# Patient Record
Sex: Female | Born: 1950 | Race: White | Hispanic: No | Marital: Married | Smoking: Current every day smoker
Health system: Southern US, Community
[De-identification: ages and names within clinical notes are randomized; demographics above are authoritative.]

## PROBLEM LIST (undated history)

## (undated) DIAGNOSIS — I509 Heart failure, unspecified: Secondary | ICD-10-CM

## (undated) DIAGNOSIS — I2510 Atherosclerotic heart disease of native coronary artery without angina pectoris: Secondary | ICD-10-CM

## (undated) HISTORY — PX: CARDIAC SURGERY: SHX584

---

## 2012-04-07 DIAGNOSIS — I781 Nevus, non-neoplastic: Secondary | ICD-10-CM | POA: Insufficient documentation

## 2015-07-14 LAB — HM PAP SMEAR: HM Pap smear: NORMAL

## 2015-10-24 LAB — HM HEPATITIS C SCREENING LAB: HM Hepatitis Screen: NEGATIVE

## 2016-05-09 DIAGNOSIS — I219 Acute myocardial infarction, unspecified: Secondary | ICD-10-CM | POA: Insufficient documentation

## 2016-05-26 DIAGNOSIS — R7989 Other specified abnormal findings of blood chemistry: Secondary | ICD-10-CM | POA: Insufficient documentation

## 2016-05-26 DIAGNOSIS — E039 Hypothyroidism, unspecified: Secondary | ICD-10-CM | POA: Insufficient documentation

## 2016-05-26 DIAGNOSIS — I10 Essential (primary) hypertension: Secondary | ICD-10-CM | POA: Insufficient documentation

## 2016-05-26 DIAGNOSIS — E785 Hyperlipidemia, unspecified: Secondary | ICD-10-CM | POA: Insufficient documentation

## 2016-05-26 DIAGNOSIS — I2119 ST elevation (STEMI) myocardial infarction involving other coronary artery of inferior wall: Secondary | ICD-10-CM | POA: Insufficient documentation

## 2016-05-26 DIAGNOSIS — D72829 Elevated white blood cell count, unspecified: Secondary | ICD-10-CM | POA: Insufficient documentation

## 2016-05-27 DIAGNOSIS — D62 Acute posthemorrhagic anemia: Secondary | ICD-10-CM | POA: Insufficient documentation

## 2016-05-27 DIAGNOSIS — I2510 Atherosclerotic heart disease of native coronary artery without angina pectoris: Secondary | ICD-10-CM | POA: Insufficient documentation

## 2016-06-17 DIAGNOSIS — I472 Ventricular tachycardia, unspecified: Secondary | ICD-10-CM | POA: Insufficient documentation

## 2016-06-17 DIAGNOSIS — J810 Acute pulmonary edema: Secondary | ICD-10-CM | POA: Insufficient documentation

## 2016-06-17 DIAGNOSIS — I340 Nonrheumatic mitral (valve) insufficiency: Secondary | ICD-10-CM | POA: Insufficient documentation

## 2016-06-23 DIAGNOSIS — R55 Syncope and collapse: Secondary | ICD-10-CM | POA: Insufficient documentation

## 2016-06-23 DIAGNOSIS — N3000 Acute cystitis without hematuria: Secondary | ICD-10-CM | POA: Insufficient documentation

## 2016-06-23 DIAGNOSIS — N189 Chronic kidney disease, unspecified: Secondary | ICD-10-CM | POA: Insufficient documentation

## 2016-09-30 LAB — HM COLONOSCOPY

## 2016-11-24 DIAGNOSIS — Z79899 Other long term (current) drug therapy: Secondary | ICD-10-CM | POA: Insufficient documentation

## 2016-11-24 DIAGNOSIS — R609 Edema, unspecified: Secondary | ICD-10-CM | POA: Insufficient documentation

## 2016-11-24 DIAGNOSIS — R0602 Shortness of breath: Secondary | ICD-10-CM | POA: Insufficient documentation

## 2016-11-24 DIAGNOSIS — R17 Unspecified jaundice: Secondary | ICD-10-CM | POA: Insufficient documentation

## 2016-11-24 DIAGNOSIS — M79605 Pain in left leg: Secondary | ICD-10-CM | POA: Insufficient documentation

## 2016-11-24 DIAGNOSIS — R600 Localized edema: Secondary | ICD-10-CM | POA: Insufficient documentation

## 2016-11-29 DIAGNOSIS — D75839 Thrombocytosis, unspecified: Secondary | ICD-10-CM | POA: Insufficient documentation

## 2016-11-29 DIAGNOSIS — R0789 Other chest pain: Secondary | ICD-10-CM | POA: Insufficient documentation

## 2016-11-29 DIAGNOSIS — K922 Gastrointestinal hemorrhage, unspecified: Secondary | ICD-10-CM | POA: Insufficient documentation

## 2016-11-29 DIAGNOSIS — N183 Chronic kidney disease, stage 3 unspecified: Secondary | ICD-10-CM | POA: Insufficient documentation

## 2019-04-27 ENCOUNTER — Ambulatory Visit (INDEPENDENT_AMBULATORY_CARE_PROVIDER_SITE_OTHER): Payer: Medicare Other

## 2019-04-27 ENCOUNTER — Ambulatory Visit (INDEPENDENT_AMBULATORY_CARE_PROVIDER_SITE_OTHER): Payer: Medicare Other | Admitting: Podiatry

## 2019-04-27 ENCOUNTER — Encounter: Payer: Self-pay | Admitting: Podiatry

## 2019-04-27 ENCOUNTER — Other Ambulatory Visit: Payer: Self-pay

## 2019-04-27 VITALS — Temp 98.5°F

## 2019-04-27 DIAGNOSIS — M79671 Pain in right foot: Secondary | ICD-10-CM

## 2019-04-27 DIAGNOSIS — L84 Corns and callosities: Secondary | ICD-10-CM

## 2019-04-27 DIAGNOSIS — M2042 Other hammer toe(s) (acquired), left foot: Secondary | ICD-10-CM

## 2019-04-27 DIAGNOSIS — L02611 Cutaneous abscess of right foot: Secondary | ICD-10-CM

## 2019-04-27 DIAGNOSIS — M2041 Other hammer toe(s) (acquired), right foot: Secondary | ICD-10-CM

## 2019-04-27 DIAGNOSIS — Z008 Encounter for other general examination: Secondary | ICD-10-CM | POA: Insufficient documentation

## 2019-04-27 MED ORDER — DOXYCYCLINE HYCLATE 100 MG PO TABS: 100.0000 mg | ORAL_TABLET | Freq: Two times a day (BID) | ORAL | 0 refills | Status: DC

## 2019-04-27 NOTE — Patient Instructions (Signed)
Hammer Toe  Hammer toe is a change in the shape (a deformity) of your toe. The deformity causes the middle joint of your toe to stay bent. This causes pain, especially when you are wearing shoes. Hammer toe starts gradually. At first, the toe can be straightened. Gradually over time, the deformity becomes stiff and permanent. Early treatments to keep the toe straight may relieve pain. As the deformity becomes stiff and permanent, surgery may be needed to straighten the toe. What are the causes? Hammer toe is caused by abnormal bending of the toe joint that is closest to your foot. It happens gradually over time. This pulls on the muscles and connections (tendons) of the toe joint, making them weak and stiff. It is often related to wearing shoes that are too short or narrow and do not let your toes straighten. What increases the risk? You may be at greater risk for hammer toe if you:  Are female.  Are older.  Wear shoes that are too small.  Wear high-heeled shoes that pinch your toes.  Are a ballet dancer.  Have a second toe that is longer than your big toe (first toe).  Injure your foot or toe.  Have arthritis.  Have a family history of hammer toe.  Have a nerve or muscle disorder. What are the signs or symptoms? The main symptoms of this condition are pain and deformity of the toe. The pain is worse when wearing shoes, walking, or running. Other symptoms may include:  Corns or calluses over the bent part of the toe or between the toes.  Redness and a burning feeling on the toe.  An open sore that forms on the top of the toe.  Not being able to straighten the toe. How is this diagnosed? This condition is diagnosed based on your symptoms and a physical exam. During the exam, your health care provider will try to straighten your toe to see how stiff the deformity is. You may also have tests, such as:  A blood test to check for rheumatoid arthritis.  An X-ray to show how  severe the deformity is. How is this treated? Treatment for this condition will depend on how stiff the deformity is. Surgery is often needed. However, sometimes a hammer toe can be straightened without surgery. Treatments that do not involve surgery include:  Taping the toe into a straightened position.  Using pads and cushions to protect the toe (orthotics).  Wearing shoes that provide enough room for the toes.  Doing toe-stretching exercises at home.  Taking an NSAID to reduce pain and swelling. If these treatments do not help or the toe cannot be straightened, surgery is the next option. The most common surgeries used to straighten a hammer toe include:  Arthroplasty. In this procedure, part of the joint is removed, and that allows the toe to straighten.  Fusion. In this procedure, cartilage between the two bones of the joint is taken out and the bones are fused together into one longer bone.  Implantation. In this procedure, part of the bone is removed and replaced with an implant to let the toe move again.  Flexor tendon transfer. In this procedure, the tendons that curl the toes down (flexor tendons) are repositioned. Follow these instructions at home:  Take over-the-counter and prescription medicines only as told by your health care provider.  Do toe straightening and stretching exercises as told by your health care provider.  Keep all follow-up visits as told by your health care   provider. This is important. How is this prevented?  Wear shoes that give your toes enough room and do not cause pain.  Do not wear high-heeled shoes. Contact a health care provider if:  Your pain gets worse.  Your toe becomes red or swollen.  You develop an open sore on your toe. This information is not intended to replace advice given to you by your health care provider. Make sure you discuss any questions you have with your health care provider. Document Revised: 01/07/2017 Document  Reviewed: 05/21/2015 Elsevier Patient Education  2020 [employer]  

## 2019-04-27 NOTE — Progress Notes (Signed)
Subjective:   Patient ID: Donna Hester, female   DOB: 68 y.o.   MRN: [id number]   HPI 69 year old female presents the office today for concerns of painful callus above her right foot.  She states that she previously had surgery to help with the callus but she states that the callus remains.  She states another podiatrist needs to trim the callus without any significant long-term relief.  She also discussed treatment options.  She also states her nails are thick and discolored her left foot on toes 3 through 5 which is been ongoing for several years but any pain.  She will get some pain in her right fourth toe at times which is been ongoing for last 2 years.  She has had no recent injury.  No increase in swelling or redness to her feet.  No other concerns.   Review of Systems  All other systems reviewed and are negative.  No past medical history on file.     Current Outpatient Medications:  .  amLODipine (NORVASC) 10 MG tablet, Take 10 mg by mouth daily., Disp: , Rfl:  .  Aspirin Buf,CaCarb-MgCarb-MgO, 81 MG TABS, Take by mouth., Disp: , Rfl:  .  atorvastatin (LIPITOR) 20 MG tablet, Take 20 mg by mouth daily., Disp: , Rfl:  .  Cholecalciferol 125 MCG (5000 UT) TABS, Take by mouth., Disp: , Rfl:  .  gabapentin (NEURONTIN) 800 MG tablet, Take by mouth., Disp: , Rfl:  .  HYDROcodone-acetaminophen (NORCO/VICODIN) 5-325 MG tablet, hydrocodone 5 mg-acetaminophen 325 mg tablet, Disp: , Rfl:  .  levothyroxine (SYNTHROID) 88 MCG tablet, levothyroxine 88 mcg tablet, Disp: , Rfl:  .  lisinopril (ZESTRIL) 40 MG tablet, lisinopril 40 mg tablet, Disp: , Rfl:  .  metoprolol succinate (TOPROL-XL) 50 MG 24 hr tablet, metoprolol succinate ER 50 mg tablet,extended release 24 hr, Disp: , Rfl:  .  nitroGLYCERIN (NITROSTAT) 0.4 MG SL tablet, nitroglycerin 0.4 mg sublingual tablet, Disp: , Rfl:  .  Omega-3 Fatty Acids (FISH OIL) 1000 MG CAPS, Take by mouth., Disp: , Rfl:  .  doxycycline (VIBRA-TABS) 100 MG  tablet, Take 1 tablet (100 mg total) by mouth 2 (two) times daily., Disp: 20 tablet, Rfl: 0  No Known Allergies        Objective:  Physical Exam  General: AAO x3, NAD  Dermatological: Hyperkeratotic tissue submetatarsal 4 upon debridement there was small amount of purulence identified.  There is no underlying ulceration noted.  Superficial abscess.  There is no surrounding erythema, ascending cellulitis.  There is no fluctuation crepitation.  There is no malodor.  Prominent metatarsal heads.  Hammertoe contractures are present.  Nails appear to be hypertrophic, dystrophic with yellow-brown discoloration.  Vascular: Dorsalis Pedis artery and Posterior Tibial artery pedal pulses are 2/4 bilateral with immedate capillary fill time. There is no pain with calf compression, swelling, warmth, erythema.   Neruologic: Grossly intact via light touch bilateral. Vibratory intact via tuning fork bilateral. Protective threshold with Semmes Wienstein monofilament intact to all pedal sites bilateral. Patellar and Achilles deep tendon reflexes 2+ bilateral. No Babinski or clonus noted bilateral.   Musculoskeletal: Hammertoes present with prominent metatarsal heads.  Muscular strength 5/5 in all groups tested bilateral.  Gait: Unassisted, Nonantalgic.       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 -I debrided the  hyperkeratotic lesion to reveal drainage underneath.  This was cultured today.  Will start doxycycline.  Offloading pads were dispensed.  -Monitor for any clinical signs or symptoms of infection and directed to call the office immediately should any occur or go to the ER.  Return in about 1 week (around 05/04/2019).  Trula Slade DPM

## 2019-05-03 LAB — WOUND CULTURE: RESULT:: NO GROWTH

## 2019-05-04 ENCOUNTER — Ambulatory Visit (INDEPENDENT_AMBULATORY_CARE_PROVIDER_SITE_OTHER): Payer: Medicare Other | Admitting: Podiatry

## 2019-05-04 ENCOUNTER — Encounter: Payer: Self-pay | Admitting: Podiatry

## 2019-05-04 ENCOUNTER — Other Ambulatory Visit: Payer: Self-pay

## 2019-05-04 VITALS — Temp 97.2°F

## 2019-05-04 DIAGNOSIS — L02611 Cutaneous abscess of right foot: Secondary | ICD-10-CM

## 2019-05-04 DIAGNOSIS — L84 Corns and callosities: Secondary | ICD-10-CM | POA: Diagnosis not present

## 2019-05-06 NOTE — Progress Notes (Addendum)
Subjective: 69 year old female presents the office today for follow evaluation of superficial abscess, callus formation to her right foot.  She states that since I last saw her she has had no pain she feels great.  She did take an antibiotic with any side effects.  She has no new concerns today.Denies any systemic complaints such as fevers, chills, nausea, vomiting. No acute changes since last appointment, and no other complaints at this time.   Objective: AAO x3, NAD DP/PT pulses palpable bilaterally, CRT less than 3 seconds Hyperkeratotic lesion submetatarsal 4 on the right foot.  Upon debridement there is no ongoing ulceration, drainage or any signs of infection.  The superficial abscess appears to be resolved.  Prominent metatarsal heads plantarly. No open lesions or pre-ulcerative lesions.  No pain with calf compression, swelling, warmth, erythema  Assessment: Hyperkeratotic lesion right foot with resolved abscess  Plan: -All treatment options discussed with the patient including all alternatives, risks, complications.  -I reviewed the x-rays with her. -Debrided the hyperkeratotic lesion to the any complications or bleeding.  Recommend moisturizer daily.  Continue metatarsal pad inside of orthotics.  No signs of infection today but finish course of antibiotics. -Patient encouraged to call the office with any questions, concerns, change in symptoms.   Donna Hester DPM

## 2019-06-08 ENCOUNTER — Encounter: Payer: Self-pay | Admitting: Podiatry

## 2019-06-08 ENCOUNTER — Other Ambulatory Visit: Payer: Self-pay

## 2019-06-08 ENCOUNTER — Ambulatory Visit (INDEPENDENT_AMBULATORY_CARE_PROVIDER_SITE_OTHER): Payer: Medicare Other

## 2019-06-08 ENCOUNTER — Ambulatory Visit (INDEPENDENT_AMBULATORY_CARE_PROVIDER_SITE_OTHER): Payer: Medicare Other | Admitting: Podiatry

## 2019-06-08 VITALS — Temp 96.5°F

## 2019-06-08 DIAGNOSIS — M79671 Pain in right foot: Secondary | ICD-10-CM | POA: Diagnosis not present

## 2019-06-08 DIAGNOSIS — M84375A Stress fracture, left foot, initial encounter for fracture: Secondary | ICD-10-CM

## 2019-06-08 DIAGNOSIS — M79672 Pain in left foot: Secondary | ICD-10-CM | POA: Diagnosis not present

## 2019-06-08 DIAGNOSIS — M779 Enthesopathy, unspecified: Secondary | ICD-10-CM | POA: Diagnosis not present

## 2019-06-08 DIAGNOSIS — L84 Corns and callosities: Secondary | ICD-10-CM

## 2019-06-08 NOTE — Progress Notes (Signed)
Subjective: 69 year old female presents the office today for concerns of painful calluses recurrent on the right foot.  She denies any open sores and denies any redness or drainage or any swelling to this area.  She does have swelling to both of her ankles which is been an intermittent issue for her previously.  She has no pain associated with swelling.  She also states that she was having pain to her left foot she points on the second MPJ.  She states the bottom of her foot this area the area was very swollen she had tenderness but has been getting better over the last couple of days.  Denies any open sores. Denies any systemic complaints such as fevers, chills, nausea, vomiting. No acute changes since last appointment, and no other complaints at this time.   Objective: AAO x3, NAD DP/PT pulses palpable bilaterally, CRT less than 3 seconds; bilateral ankle edema is present without any pain, erythema. Hyperkeratotic tissue present right foot submetatarsal area.  No underlying ulceration drainage or signs of infection.  No signs of an abscess. There is tenderness submetatarsal 2 on the left foot and there is mild edema to the second MPJ.  There is no erythema or warmth.  No area of pinpoint tenderness. No edema, erythema, increase in warmth to bilateral lower extremities.  No open lesions or pre-ulcerative lesions.  No pain with calf compression, swelling, warmth, erythema  Assessment: Left foot second MPJ capsulitis; right foot hyperkeratotic lesion; ankle swelling  Plan: -All treatment options discussed with the patient including all alternatives, risks, complications.  -X-rays obtained of the left foot.  Independently reviewed these and I was not able to appreciate any evidence of acute fracture.  A steroid injection was performed of the left second interspace today.  Skin was cleaned with alcohol and mixture of 1 cc Kenalog 10, 0.5 cc of Marcaine plain, 0.5 cc of lidocaine plain was infiltrated  into the area of maximal tenderness without complications.  Postinjection care discussed. -Debrided hyperkeratotic lesion right foot without any complications or bleeding. -Dispensed metatarsal offloading gel pads -Recommend follow-up with cardiology for ankle swelling.  She has no chest pain, shortness of breath or any other issues. No pain to the leg or any swelling to the legs.  -Patient encouraged to call the office with any questions, concerns, change in symptoms.   Trula Slade DPM

## 2019-07-13 ENCOUNTER — Encounter: Payer: Self-pay | Admitting: Podiatry

## 2019-07-13 ENCOUNTER — Ambulatory Visit (INDEPENDENT_AMBULATORY_CARE_PROVIDER_SITE_OTHER): Payer: Medicare Other | Admitting: Podiatry

## 2019-07-13 ENCOUNTER — Other Ambulatory Visit: Payer: Self-pay

## 2019-07-13 VITALS — Temp 98.5°F

## 2019-07-13 DIAGNOSIS — M779 Enthesopathy, unspecified: Secondary | ICD-10-CM

## 2019-07-13 DIAGNOSIS — M2041 Other hammer toe(s) (acquired), right foot: Secondary | ICD-10-CM | POA: Diagnosis not present

## 2019-07-13 DIAGNOSIS — M2042 Other hammer toe(s) (acquired), left foot: Secondary | ICD-10-CM

## 2019-07-13 DIAGNOSIS — L989 Disorder of the skin and subcutaneous tissue, unspecified: Secondary | ICD-10-CM | POA: Diagnosis not present

## 2019-07-13 NOTE — Progress Notes (Signed)
Subjective: 70 year old female presents the office today for painful callus on the right foot which likely trimmed.  Denies any redness or drainage or any swelling.  The injection last appointment was helpful she is not on any pain or swelling.  She is notes her left second toe started to trip towards her big toe some but not causing any pain.  Currently denies any pain to the ankles.  No recent injury or changes otherwise.  She has not yet followed with cardiology for ankle swelling she has no chest pain, shortness of breath or any other symptoms. Denies any systemic complaints such as fevers, chills, nausea, vomiting. No acute changes since last appointment, and no other complaints at this time.   Objective: AAO x3, NAD DP/PT pulses palpable bilaterally, CRT less than 3 seconds Thick hyperkeratotic tissue right foot submetatarsal 3 area upon debridement there is no underlying ulceration drainage or any signs of infection noted.  Hyperkeratotic tissue to the distal fourth toe on the right foot as well as second toe without any underlying ulceration.  Proximal metatarsal head plantarly with atrophy of the fat pad.  Hammertoe contractures present with slight medial deviation of the left second toe.  No pain the second MPJ or other areas of the foot bilaterally or the ankles.  No open lesions or pre-ulcerative lesions.  No pain with calf compression, swelling, warmth, erythema  Assessment: 69 year old female with symptomatic preulcerative callus to the right foot with hammertoe contractures  Plan: -All treatment options discussed with the patient including all alternatives, risks, complications.  -I dispensed toe separator's.  Debrided the hyperkeratotic lesions on the right foot with any complications or bleeding.  Continue moisturizer daily as well as offloading.  I discussed with her both conservative as well as surgical options for the recurrent callus and she wants to continue with conservative  debridements periodically.  She was to come back in 6 weeks and I will see her again. -Patient encouraged to call the office with any questions, concerns, change in symptoms.   Trula Slade DPM

## 2019-08-24 ENCOUNTER — Other Ambulatory Visit: Payer: Self-pay

## 2019-08-24 ENCOUNTER — Encounter: Payer: Self-pay | Admitting: Podiatry

## 2019-08-24 ENCOUNTER — Ambulatory Visit (INDEPENDENT_AMBULATORY_CARE_PROVIDER_SITE_OTHER): Payer: Medicare Other | Admitting: Podiatry

## 2019-08-24 VITALS — Temp 98.1°F

## 2019-08-24 DIAGNOSIS — M2041 Other hammer toe(s) (acquired), right foot: Secondary | ICD-10-CM

## 2019-08-24 DIAGNOSIS — L989 Disorder of the skin and subcutaneous tissue, unspecified: Secondary | ICD-10-CM | POA: Diagnosis not present

## 2019-08-24 DIAGNOSIS — M2042 Other hammer toe(s) (acquired), left foot: Secondary | ICD-10-CM | POA: Diagnosis not present

## 2019-08-24 DIAGNOSIS — M216X1 Other acquired deformities of right foot: Secondary | ICD-10-CM

## 2019-08-24 NOTE — Patient Instructions (Signed)
Keep the bandage on for 24 hours. At that time, remove and clean with soap and water. If it hurts or burns before 24 hours go ahead and remove the bandage and wash with soap and water. Keep the area clean. If there is any blistering cover with antibiotic ointment and a bandage. Monitor for any redness, drainage, or other signs of infection. Call the office if any are to occur. If you have any questions, please call the office at [phone].  

## 2019-08-26 NOTE — Progress Notes (Signed)
Subjective: 69 year old female presents the office today for painful callus on the right foot which likely trimmed.  She states the area started become painful again.  She is also concerned that the third toe started becoming hammertoe on the right foot as well as the left second toe.  No changes otherwise.  No other concerns today. Denies any systemic complaints such as fevers, chills, nausea, vomiting. No acute changes since last appointment, and no other complaints at this time.   Objective: AAO x3, NAD DP/PT pulses palpable bilaterally, CRT less than 3 seconds Thick hyperkeratotic tissue right foot submetatarsal 3 area upon debridement there is no underlying ulceration drainage or any signs of infection noted.  Prominent metatarsal head plantarly. Hammertoe contractures present with mild erythema from irritation in shoes. No skin breakdown otherwise. No pain with calf compression, swelling, warmth, erythema  Assessment: 69 year old female with symptomatic preulcerative callus to the right foot with hammertoe contractures  Plan: -All treatment options discussed with the patient including all alternatives, risks, complications.  -Debrided the hyperkeratotic lesion to the any complications or bleeding.  Skin was cleaned with alcohol and a pad was placed followed by salicylic acid and a bandage.  Post procedure instructions discussed.  Monitor for any signs or symptoms of infection discussed with her possibly surgical excision of this area and if she decides to do that she is considering having her hammertoes corrected as well.  Dispensed offloading pads for the hammertoes  Return in about 6 weeks (around 10/05/2019).  Trula Slade DPM

## 2019-09-07 ENCOUNTER — Ambulatory Visit: Payer: Medicare Other | Admitting: Podiatry

## 2019-10-05 ENCOUNTER — Other Ambulatory Visit: Payer: Self-pay

## 2019-10-05 ENCOUNTER — Ambulatory Visit (INDEPENDENT_AMBULATORY_CARE_PROVIDER_SITE_OTHER): Payer: Medicare Other | Admitting: Podiatry

## 2019-10-05 VITALS — Temp 97.4°F

## 2019-10-05 DIAGNOSIS — L989 Disorder of the skin and subcutaneous tissue, unspecified: Secondary | ICD-10-CM | POA: Diagnosis not present

## 2019-10-06 NOTE — Progress Notes (Signed)
Subjective: 69 year old female presents the office today for painful callus on the right foot which likely trimmed.  Last appointment tried salicylic acid and she states that it helps quite a bit.  She has no other concerns to her feet.  Still having gets swelling to her ankles.  She has fluid pills at home but she has not taken.  She does not see her cardiologist until October.  Denies any chest pain or shortness of breath.    Objective: AAO x3, NAD DP/PT pulses palpable bilaterally, CRT less than 3 seconds Chronic ankle edema present with mild pitting edema to the legs bilaterally.  There is no pain with calf compression, erythema or warmth. Thick hyperkeratotic tissue right foot submetatarsal 3 area upon debridement there is no underlying ulceration drainage or any signs of infection noted.  Prominent metatarsal head plantarly. No pain with calf compression, swelling, warmth, erythema  Assessment: 69 year old female with symptomatic preulcerative callus; chronic ankle edema  Plan: -All treatment options discussed with the patient including all alternatives, risks, complications.  -Debrided the hyperkeratotic lesion to the any complications or bleeding.  Skin was cleaned with alcohol and a pad was placed followed by salicylic acid and a bandage.  Post procedure instructions discussed.  Monitor for any signs or symptoms of infection  -She has fluid pills at home but she has not taken.  Discussed taking medication as directed by her physician.  Also encouraged to call her cardiologist.  Unfortunately she does not have a primary care physician.  Return in about 6 weeks  Trula Slade DPM

## 2019-11-16 ENCOUNTER — Ambulatory Visit: Payer: Medicare Other | Admitting: Podiatry

## 2019-12-07 ENCOUNTER — Ambulatory Visit: Payer: Medicare Other | Admitting: Podiatry

## 2019-12-21 ENCOUNTER — Ambulatory Visit (INDEPENDENT_AMBULATORY_CARE_PROVIDER_SITE_OTHER): Payer: Medicare Other | Admitting: Podiatry

## 2019-12-21 ENCOUNTER — Other Ambulatory Visit: Payer: Self-pay

## 2019-12-21 DIAGNOSIS — L989 Disorder of the skin and subcutaneous tissue, unspecified: Secondary | ICD-10-CM | POA: Diagnosis not present

## 2019-12-21 DIAGNOSIS — M79671 Pain in right foot: Secondary | ICD-10-CM

## 2019-12-21 DIAGNOSIS — D492 Neoplasm of unspecified behavior of bone, soft tissue, and skin: Secondary | ICD-10-CM

## 2019-12-21 DIAGNOSIS — M2042 Other hammer toe(s) (acquired), left foot: Secondary | ICD-10-CM

## 2019-12-21 DIAGNOSIS — M216X1 Other acquired deformities of right foot: Secondary | ICD-10-CM

## 2019-12-21 DIAGNOSIS — M2041 Other hammer toe(s) (acquired), right foot: Secondary | ICD-10-CM | POA: Diagnosis not present

## 2019-12-21 NOTE — Progress Notes (Signed)
Subjective: 69 year old female presents the office today for painful callus on the right foot which is recurrent.  She said the area becomes tender with pressure in shoes.  We have previously discussed surgical intervention and she will consider this after the first of the year.  Currently denies any open sores and denies redness or joint swelling.  Denies any fevers or chills.  No nausea or vomiting.  Objective: AAO x3, NAD DP/PT pulses palpable bilaterally, CRT less than 3 seconds Chronic ankle edema present with mild pitting edema to the legs bilaterally with the right side slightly worse than left and mild pitting is present.  There is no pain with calf compression, erythema or warmth. Thick hyperkeratotic tissue right foot submetatarsal 3 area upon debridement there is no underlying ulceration drainage or any signs of infection noted.  Prominent metatarsal head plantarly.  Hammertoe deformity third toe. No pain with calf compression, swelling, warmth, erythema  Assessment: 69 year old female with symptomatic preulcerative callus; chronic ankle edema  Plan: -All treatment options discussed with the patient including all alternatives, risks, complications.  -Debrided the hyperkeratotic lesion to the any complications or bleeding.  Skin was cleaned with alcohol and a pad was placed followed by salicylic acid and a bandage.  Post procedure instructions discussed.  Monitor for any signs or symptoms of infection  -Discussed surgical intervention.  We will request the records from her prior surgeon to review.  Discussed with her possible third metatarsal osteotomy with hammertoe repair third digit. -She is following up with a new primary care physician.  Encouraged her to discuss the swelling.  She has seen cardiology.   Trula Slade DPM

## 2020-01-11 ENCOUNTER — Other Ambulatory Visit: Payer: Self-pay

## 2020-01-11 ENCOUNTER — Ambulatory Visit (INDEPENDENT_AMBULATORY_CARE_PROVIDER_SITE_OTHER): Payer: Medicare Other | Admitting: Podiatry

## 2020-01-11 DIAGNOSIS — M79671 Pain in right foot: Secondary | ICD-10-CM | POA: Diagnosis not present

## 2020-01-11 DIAGNOSIS — D492 Neoplasm of unspecified behavior of bone, soft tissue, and skin: Secondary | ICD-10-CM | POA: Diagnosis not present

## 2020-01-11 DIAGNOSIS — L989 Disorder of the skin and subcutaneous tissue, unspecified: Secondary | ICD-10-CM | POA: Diagnosis not present

## 2020-01-11 DIAGNOSIS — D2371 Other benign neoplasm of skin of right lower limb, including hip: Secondary | ICD-10-CM | POA: Diagnosis not present

## 2020-01-16 NOTE — Progress Notes (Addendum)
Subjective: 69 year old female presents the office today for painful callus on the right foot which is recurrent but she does have trimmed today.  She was to consider surgical weight after the first of the year. Currently denies any open sores and denies redness or joint swelling.  Denies any fevers or chills.  No nausea or vomiting.  Objective: AAO x3, NAD DP/PT pulses palpable bilaterally, CRT less than 3 seconds Chronic ankle edema present with mild pitting edema to the legs bilaterally with the right side slightly worse than left and mild pitting is present.  There is no pain with calf compression, erythema or warmth.  Swelling somewhat improved today. Thick hyperkeratotic tissue right foot submetatarsal 3 area upon debridement there is no underlying ulceration drainage or any signs of infection noted.  Prominent metatarsal head plantarly.  Hammertoe deformity third toe. No pain with calf compression, swelling, warmth, erythema  Assessment: 69 year old female with symptomatic preulcerative callus/wart; chronic ankle edema  Plan: -All treatment options discussed with the patient including all alternatives, risks, complications.  -Debrided the hyperkeratotic lesion to the any complications or bleeding.  Skin was cleaned with alcohol and a pad was placed followed by salicylic acid and a bandage.  Post procedure instructions discussed.  Monitor for any signs or symptoms of infection  -Discussed surgical intervention.  Discussed metatarsal floating osteotomy.  She was to consider doing this next year. -Continue follow-up with PCP/cardiology for swelling.   Trula Slade DPM

## 2020-02-08 LAB — HM MAMMOGRAPHY

## 2020-02-23 ENCOUNTER — Encounter: Payer: Self-pay | Admitting: Podiatry

## 2020-02-29 ENCOUNTER — Ambulatory Visit: Payer: Medicare Other | Admitting: Podiatry

## 2020-02-29 ENCOUNTER — Encounter: Payer: Self-pay | Admitting: Podiatry

## 2020-03-14 ENCOUNTER — Ambulatory Visit (INDEPENDENT_AMBULATORY_CARE_PROVIDER_SITE_OTHER): Payer: Medicare Other | Admitting: Podiatry

## 2020-03-14 ENCOUNTER — Other Ambulatory Visit: Payer: Self-pay

## 2020-03-14 DIAGNOSIS — R609 Edema, unspecified: Secondary | ICD-10-CM

## 2020-03-14 DIAGNOSIS — M2042 Other hammer toe(s) (acquired), left foot: Secondary | ICD-10-CM | POA: Diagnosis not present

## 2020-03-14 DIAGNOSIS — D492 Neoplasm of unspecified behavior of bone, soft tissue, and skin: Secondary | ICD-10-CM | POA: Diagnosis not present

## 2020-03-14 DIAGNOSIS — M216X1 Other acquired deformities of right foot: Secondary | ICD-10-CM | POA: Diagnosis not present

## 2020-03-19 NOTE — Progress Notes (Signed)
Subjective: 70 year old female presents the office today for painful callus on the right foot which is recurrent but she would like to have trimmed today.  She was to hold off any surgical intervention the right foot this point may be considered during the spring but her main concern is the hammertoe on the left foot which causes discomfort but she denies any open lesions.  No recent injury but she tries to wear shoes at avoid pressure as well as offloading pads. Currently denies any open sores and denies redness or joint swelling.  Denies any fevers or chills.  No nausea or vomiting.  Objective: AAO x3, NAD DP/PT pulses palpable bilaterally, CRT less than 3 seconds Mild chronic ankle edema present with mild pitting edema to the legs bilaterally with the right side slightly worse than left and mild pitting is present.  There is no pain with calf compression, erythema or warmth.  Thick hyperkeratotic tissue right foot submetatarsal 3 area upon debridement there is no underlying ulceration drainage or any signs of infection noted.  Prominent metatarsal head plantarly.   Hammertoe deformity third toe. No pain with calf compression, swelling, warmth, erythema  Assessment: 70 year old female with symptomatic preulcerative callus/wart; hammertoe left foot; chronic edema  Plan: -All treatment options discussed with the patient including all alternatives, risks, complications.  -Debrided the hyperkeratotic lesion to the any complications or bleeding.  Skin was cleaned with alcohol and a pad was placed followed by salicylic acid and a bandage.  Post procedure instructions discussed.  Monitor for any signs or symptoms of infection  -Continue offloading for the hammertoe on the left side.  Continue shoe modifications as well. -Discussed surgical intervention.  Discussed metatarsal floating osteotomy.  She was to consider doing this next year. -Continue follow-up with PCP/cardiology for swelling.   Trula Slade DPM

## 2020-05-01 ENCOUNTER — Telehealth: Payer: Self-pay | Admitting: Podiatry

## 2020-05-01 NOTE — Telephone Encounter (Signed)
Why was this appointment cancelled? Was it that she cancelled or we cancelled? If we cancelled for whatever reason I want to get her in to be seen tomorrow.

## 2020-05-01 NOTE — Telephone Encounter (Signed)
Patient cancelled due to last minute emergency

## 2020-05-01 NOTE — Telephone Encounter (Signed)
Oh yes, I was thinking about her daughter. She is fine for right now to wait. Thanks!

## 2020-05-01 NOTE — Telephone Encounter (Signed)
Patient was due for 6 week f/u with recommended date to return for 04/25/20. Patient was scheduled for 05/02/20 but cancelled. Offered to reschedule appointment for next available patient stated it was too far away. Patient then mentioned they were told if they needed anything to call in. Patient decided not to reschedule and will call back to R/S

## 2020-05-01 NOTE — Telephone Encounter (Signed)
Patient stated her feet arent bothering her and will call back for appointment in April, also mentioned you may have her confused with her daughter Donna Hester, Please Advise

## 2020-05-01 NOTE — Telephone Encounter (Signed)
Ok thanks 

## 2020-05-01 NOTE — Telephone Encounter (Signed)
Ok- please see if she can see someone else while I am out in [place].

## 2020-05-02 ENCOUNTER — Ambulatory Visit: Payer: Medicare Other | Admitting: Podiatry

## 2020-06-04 ENCOUNTER — Encounter: Payer: Self-pay | Admitting: Podiatry

## 2020-06-05 ENCOUNTER — Other Ambulatory Visit: Payer: Self-pay | Admitting: Podiatry

## 2020-06-05 MED ORDER — GABAPENTIN 800 MG PO TABS: 800.0000 mg | ORAL_TABLET | Freq: Three times a day (TID) | ORAL | 2 refills | Status: DC

## 2020-06-06 ENCOUNTER — Other Ambulatory Visit: Payer: Self-pay

## 2020-06-06 ENCOUNTER — Ambulatory Visit (INDEPENDENT_AMBULATORY_CARE_PROVIDER_SITE_OTHER): Payer: Medicare Other | Admitting: Podiatry

## 2020-06-06 ENCOUNTER — Encounter: Payer: Self-pay | Admitting: Podiatry

## 2020-06-06 DIAGNOSIS — N189 Chronic kidney disease, unspecified: Secondary | ICD-10-CM | POA: Diagnosis not present

## 2020-06-06 DIAGNOSIS — G629 Polyneuropathy, unspecified: Secondary | ICD-10-CM

## 2020-06-06 DIAGNOSIS — D492 Neoplasm of unspecified behavior of bone, soft tissue, and skin: Secondary | ICD-10-CM

## 2020-06-06 DIAGNOSIS — L989 Disorder of the skin and subcutaneous tissue, unspecified: Secondary | ICD-10-CM

## 2020-06-06 NOTE — Progress Notes (Signed)
Subjective: 70 year old female presents the office today for painful callus on the right foot which is recurrent but she would like to have trimmed today. She states that the spot is sore. Denies any opening, drainage, redness, swelling. Currently denies any open sores and denies redness or joint swelling.  Denies any fevers or chills.  No nausea or vomiting.  Objective: AAO x3, NAD DP/PT pulses palpable bilaterally, CRT less than 3 seconds Mild chronic ankle edema present with mild pitting edema to the legs bilaterally with the right side slightly worse than left and mild pitting is present.  There is no pain with calf compression, erythema or warmth.  Thick hyperkeratotic tissue right foot submetatarsal 3 area upon debridement there is no underlying ulceration drainage or any signs of infection noted.  Prominent metatarsal head plantarly.   Hammertoe deformity third toe. No pain with calf compression, swelling, warmth, erythema  Assessment: 70 year old female with symptomatic preulcerative callus/wart; hammertoe left foot; chronic edema  Plan: -All treatment options discussed with the patient including all alternatives, risks, complications.  -Sharply debrided the hyperkeratotic lesion.  Lesion was quite thick today.  Upon debridement there was a small mount of bleeding occurring.  Areas cleaned with alcohol and antibiotic ointment was applied followed by dressing.  Monitor for any signs or symptoms of infection.  Offloading pads dispensed.  -She is currently on gabapentin.  I did order complete metabolic panel because I like to check her kidney function.  She has been on gabapentin chronically.  It was ordered 3 times a day but she states that she only takes it twice a day.   Trula Slade DPM

## 2020-06-06 NOTE — Patient Instructions (Signed)
Apply a small amount of antibiotic ointment on the right foot daily. Keep a pad around the area to help decrease the pressure.  Monitor for any swelling, redness, drainage, pain or any other signs of infection. If there are any issues, please call the office or go to the ER.

## 2020-06-07 LAB — COMPLETE METABOLIC PANEL WITH GFR
AG Ratio: 1.6 (calc) (ref 1.0–2.5)
ALT: 13 U/L (ref 6–29)
AST: 13 U/L (ref 10–35)
Albumin: 4.2 g/dL (ref 3.6–5.1)
Alkaline phosphatase (APISO): 94 U/L (ref 37–153)
BUN/Creatinine Ratio: 19 (calc) (ref 6–22)
BUN: 24 mg/dL (ref 7–25)
CO2: 29 mmol/L (ref 20–32)
Calcium: 9.9 mg/dL (ref 8.6–10.4)
Chloride: 107 mmol/L (ref 98–110)
Creat: 1.24 mg/dL — ABNORMAL HIGH (ref 0.50–0.99)
GFR, Est African American: 52 mL/min/{1.73_m2} — ABNORMAL LOW (ref >=60)
GFR, Est Non African American: 45 mL/min/{1.73_m2} — ABNORMAL LOW (ref >=60)
Globulin: 2.6 g/dL (calc) (ref 1.9–3.7)
Glucose, Bld: 88 mg/dL (ref 65–99)
Potassium: 4.8 mmol/L (ref 3.5–5.3)
Sodium: 140 mmol/L (ref 135–146)
Total Bilirubin: 0.5 mg/dL (ref 0.2–1.2)
Total Protein: 6.8 g/dL (ref 6.1–8.1)

## 2020-07-04 ENCOUNTER — Ambulatory Visit (INDEPENDENT_AMBULATORY_CARE_PROVIDER_SITE_OTHER): Payer: Medicare Other | Admitting: Podiatry

## 2020-07-04 ENCOUNTER — Encounter: Payer: Self-pay | Admitting: Podiatry

## 2020-07-04 ENCOUNTER — Other Ambulatory Visit: Payer: Self-pay

## 2020-07-04 DIAGNOSIS — L600 Ingrowing nail: Secondary | ICD-10-CM | POA: Diagnosis not present

## 2020-07-04 DIAGNOSIS — G629 Polyneuropathy, unspecified: Secondary | ICD-10-CM

## 2020-07-04 DIAGNOSIS — L989 Disorder of the skin and subcutaneous tissue, unspecified: Secondary | ICD-10-CM | POA: Diagnosis not present

## 2020-07-04 DIAGNOSIS — B351 Tinea unguium: Secondary | ICD-10-CM | POA: Diagnosis not present

## 2020-07-04 MED ORDER — CEPHALEXIN 500 MG PO CAPS: 500.0000 mg | ORAL_CAPSULE | Freq: Two times a day (BID) | ORAL | 0 refills | Status: DC

## 2020-07-04 NOTE — Patient Instructions (Signed)

## 2020-07-05 NOTE — Progress Notes (Signed)
Subjective: 70 year old female presents the office today for painful callus on the right foot which is recurrent but she would like to have trimmed today.  Also asking for the right third, fourth, fifth toenails to be removed as the are causing discomfort.  Denies any redness or drainage or any swelling.  She thinks they have been removed previously on the fourth and fifth toe.  The third toenail has been removed previously and a small portion of nail started to grow back causing discomfort.    Objective: AAO x3, NAD DP/PT pulses palpable bilaterally, CRT less than 3 seconds Mild chronic ankle edema present with mild pitting edema to the legs bilaterally with the right side slightly worse than left and mild pitting is present.  There is no pain with calf compression, erythema or warmth.  Thick hyperkeratotic tissue right foot submetatarsal 3 area upon debridement there is no underlying ulceration drainage or any signs of infection noted.  Prominent metatarsal head plantarly.   It appears that a of the right third nails come back and on the fourth and fifth toe Nails are hypertrophic, dystrophic and discolored with brown discoloration.  It appears that the nails have partially been removed previously and the remainder of the nail is thickened discolored and there is some callus formation along the adjacent portion of the nail. Hammertoe deformity third toe. No pain with calf compression, swelling, warmth, erythema  Assessment: 70 year old female with symptomatic preulcerative callus/ingrown toenail, onychodystrophy  Plan: -All treatment options discussed with the patient including all alternatives, risks, complications.  -Sharply debrided the hyperkeratotic lesion.  Lesion was quite thick today.  -She is currently on gabapentin.  Creatinine is been stable but encouraged her to follow-up with the primary care physician as she does not have 1.  Offered to put a referral in but she is going to check  upstairs at the primary care office for an appointment. -At this time, the patient is requesting total nail removal with chemical matricectomy to the symptomatic portion of the nail. Risks and complications were discussed with the patient for which they understand and written consent was obtained. Under sterile conditions a total of 3 mL of a mixture of 2% lidocaine plain and 0.5% Marcaine plain was infiltrated in a digital block fashion. Once anesthetized, the skin was prepped in sterile fashion. A tourniquet was then applied. Next the right 3rd, 4th and 5th nails were then excised making sure to remove the entire offending nail border. Once the nails were ensured to be removed area was debrided and the underlying skin was intact. There is no purulence identified in the procedure. Next phenol was then applied under standard conditions and copiously irrigated. Silvadene was applied. A dry sterile dressing was applied. After application of the dressing the tourniquet was removed and there is found to be an immediate capillary refill time to the digit. The patient tolerated the procedure well any complications. Post procedure instructions were discussed the patient for which he verbally understood. Follow-up in one week for nail check or sooner if any problems are to arise. Discussed signs/symptoms of infection and directed to call the office immediately should any occur or go directly to the emergency room. In the meantime, encouraged to call the office with any questions, concerns, changes symptoms. -Keflex  Return in about 2 weeks (around 07/18/2020) for nail check .  Trula Slade DPM    Trula Slade DPM

## 2020-07-18 ENCOUNTER — Encounter: Payer: Self-pay | Admitting: Podiatry

## 2020-07-18 ENCOUNTER — Ambulatory Visit (INDEPENDENT_AMBULATORY_CARE_PROVIDER_SITE_OTHER): Payer: Medicare Other | Admitting: Podiatry

## 2020-07-18 ENCOUNTER — Other Ambulatory Visit: Payer: Self-pay

## 2020-07-18 DIAGNOSIS — L600 Ingrowing nail: Secondary | ICD-10-CM

## 2020-07-18 NOTE — Progress Notes (Signed)
Subjective: Donna Hester is a 70 y.o.  female returns to office today for follow up evaluation after having right third, fourth, fifth total nail avulsion performed.  She was soaking Epson salts but she has stopped with a have been healing well without any drainage.  No drainage or pus.  No redness or swelling.  No pain.  Patient denies fevers, chills, nausea, vomiting. Denies any calf pain, chest pain, SOB.   Objective:   General: Well developed, nourished, in no acute distress, alert and oriented x3   Dermatology: Skin is warm, dry and supple bilateral.  Right nail beds appears to be clean, dry, with mild surrounding scab.  No open lesions are identified.  There is no surrounding erythema, edema, drainage/purulence.  Left third, fourth, fifth digit nails are hypertrophic, dystrophic and she is asked about these to be removed next appointment.  They cause irritation inside shoes.  Neurovascular status: Intact. No lower extremity swelling; No pain with calf compression bilateral.  Musculoskeletal: No tenderness to palpation of the right nailbeds.. Muscular strength within normal limits bilateral.   Assesement and Plan: S/p partial nail avulsion, doing well.   -Continue soaking in epsom salts twice a day followed by antibiotic ointment and a band-aid. Can leave uncovered at night. Continue this until completely healed.  -If the area has not healed in 2 weeks, call the office for follow-up appointment, or sooner if any problems arise.  -Monitor for any signs/symptoms of infection. Call the office immediately if any occur or go directly to the emergency room. Call with any questions/concerns.  *Likely removal of left third through fifth digit toenails next appointment.  Celesta Gentile, DPM

## 2020-08-23 ENCOUNTER — Encounter: Payer: Self-pay | Admitting: Podiatry

## 2020-08-29 ENCOUNTER — Ambulatory Visit (INDEPENDENT_AMBULATORY_CARE_PROVIDER_SITE_OTHER): Payer: Medicare Other | Admitting: Podiatry

## 2020-08-29 ENCOUNTER — Other Ambulatory Visit: Payer: Self-pay

## 2020-08-29 DIAGNOSIS — Z0000 Encounter for general adult medical examination without abnormal findings: Secondary | ICD-10-CM | POA: Diagnosis not present

## 2020-08-29 DIAGNOSIS — L600 Ingrowing nail: Secondary | ICD-10-CM | POA: Diagnosis not present

## 2020-08-29 DIAGNOSIS — M79675 Pain in left toe(s): Secondary | ICD-10-CM | POA: Diagnosis not present

## 2020-08-29 DIAGNOSIS — B351 Tinea unguium: Secondary | ICD-10-CM

## 2020-08-29 MED ORDER — CEPHALEXIN 500 MG PO CAPS: 500.0000 mg | ORAL_CAPSULE | Freq: Two times a day (BID) | ORAL | 0 refills | Status: DC

## 2020-08-29 NOTE — Patient Instructions (Addendum)

## 2020-09-03 NOTE — Progress Notes (Signed)
Subjective: 70 year old female presents the office today for concerns of her toenails on her left foot digits 3, 4, 5 causing pain is very thick and she was to have these removed.  She previously had the right side removed and she has done well from this.  Callus on the right foot, some mild. Denies any open sores or swelling or any drainage. Denies any systemic complaints such as fevers, chills, nausea, vomiting. No acute changes since last appointment, and no other complaints at this time.   Asking for referral to primary care.  Objective: AAO x3, NAD DP/PT pulses palpable bilaterally, CRT less than 3 seconds Nails 3, 4, 5 on the left foot are hypertrophic, dystrophic with yellow-brown discoloration and the underlying ulceration drainage or any signs of infection.  And incurvation of the nails are present.  They are causing discomfort.  No edema, erythema or any signs of infection.  Status post nail avulsions of the right third, fourth, fifth which are healed.  Hyperkeratotic lesion right foot submetatarsal without no open lesions or pre-ulcerative lesions.  No pain with calf compression, swelling, warmth, erythema  Assessment: Symptomatic onychomycosis, ingrown toenails left third through fifth digits  Plan: -All treatment options discussed with the patient including all alternatives, risks, complications.  -As a courtesy debrided hyperkeratotic tissue right foot any complications or bleeding.  Continue moisturizer and offloading.  There is mild callus compared to previous. -At this time, the patient is requesting total nail removal with chemical matricectomy to the left third through fifth nails.  Risks and complications were discussed with the patient for which they understand and written consent was obtained. Under sterile conditions a total of 3 mL of 2% lidocaine plain was infiltrated in a digital block fashion. Once anesthetized, the skin was prepped in sterile fashion. A tourniquet was  then applied. Next the left third, fourth, fifth nails were then excised making sure to remove the entire offending nail border. Once the nails were ensured to be removed area was debrided and the underlying skin was intact. There is no purulence identified in the procedure. Next phenol was then applied under standard conditions and copiously irrigated. Silvadene was applied. A dry sterile dressing was applied. After application of the dressing the tourniquet was removed and there is found to be an immediate capillary refill time to the digit. The patient tolerated the procedure well any complications. Post procedure instructions were discussed the patient for which he verbally understood. Follow-up in one week for nail check or sooner if any problems are to arise. Discussed signs/symptoms of infection and directed to call the office immediately should any occur or go directly to the emergency room. In the meantime, encouraged to call the office with any questions, concerns, changes symptoms. -Keflex -Referral to primary care placed -Patient encouraged to call the office with any questions, concerns, change in symptoms.   Trula Slade DPM

## 2020-09-08 ENCOUNTER — Other Ambulatory Visit: Payer: Self-pay

## 2020-09-08 ENCOUNTER — Emergency Department (INDEPENDENT_AMBULATORY_CARE_PROVIDER_SITE_OTHER): Payer: Medicare Other

## 2020-09-08 ENCOUNTER — Emergency Department (INDEPENDENT_AMBULATORY_CARE_PROVIDER_SITE_OTHER)
Admission: EM | Admit: 2020-09-08 | Discharge: 2020-09-08 | Disposition: A | Discharge status: Home / Self Care | Payer: Medicare Other | Source: Home / Self Care | Attending: Family Medicine | Admitting: Family Medicine

## 2020-09-08 DIAGNOSIS — M4696 Unspecified inflammatory spondylopathy, lumbar region: Secondary | ICD-10-CM | POA: Diagnosis not present

## 2020-09-08 DIAGNOSIS — M5432 Sciatica, left side: Secondary | ICD-10-CM

## 2020-09-08 DIAGNOSIS — I700 Atherosclerosis of aorta: Secondary | ICD-10-CM | POA: Diagnosis not present

## 2020-09-08 HISTORY — DX: Atherosclerotic heart disease of native coronary artery without angina pectoris: I25.10

## 2020-09-08 HISTORY — DX: Heart failure, unspecified: I50.9

## 2020-09-08 MED ORDER — TIZANIDINE HCL 4 MG PO TABS: 4.0000 mg | ORAL_TABLET | Freq: Four times a day (QID) | ORAL | 0 refills | Status: DC | PRN

## 2020-09-08 MED ORDER — HYDROCODONE-ACETAMINOPHEN 5-325 MG PO TABS: 1.0000 | ORAL_TABLET | Freq: Four times a day (QID) | ORAL | 0 refills | Status: DC | PRN

## 2020-09-08 MED ORDER — KETOROLAC TROMETHAMINE 30 MG/ML IJ SOLN
30.0000 mg | Freq: Once | INTRAMUSCULAR | Status: AC
  Administered 2020-09-08: 30 mg via INTRAMUSCULAR

## 2020-09-08 MED ORDER — PREDNISONE 20 MG PO TABS: 20.0000 mg | ORAL_TABLET | Freq: Two times a day (BID) | ORAL | 0 refills | Status: DC

## 2020-09-08 NOTE — ED Provider Notes (Signed)
[facility]    CSN: [id number] Arrival date & time: 09/08/20  1732      History   Chief Complaint Chief Complaint  Patient presents with   Hip Pain    Left      HPI Donna Hester is a 70 y.o. female.   HPI  Patient is here for back pain.  It starts in her left low back and goes into her left buttock and down the back of her thigh to her ankle.  Is worse with activity.  Better with rest.  No numbness or weakness of the leg.  No history of back problems.  No history of back arthritis or injury.  Patient does have arthritis in other joints.  No bowel or bladder complaint.  Patient also has history of heart disease, hypertension, hyperlipidemia.  She is a smoker.  She has known atherosclerotic disease.  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  Denies diabetes.  States she does not currently have a primary care doctor.  Is advised that this is important.  Past Medical History:  Diagnosis Date   CHF (congestive heart failure) ([place])    Coronary artery disease     Patient Active Problem List   Diagnosis Date Noted   Encounter for specialized medical examination 04/27/2019   Atypical chest pain 11/29/2016   GI bleed 11/29/2016   Stage 3 chronic kidney disease ([place]) 11/29/2016   Thrombocytosis 11/29/2016   Jaundice 11/24/2016   On statin therapy 11/24/2016   Pain in both lower extremities 11/24/2016   Peripheral edema 11/24/2016   SOB (shortness of breath) 11/24/2016   Acute cystitis without hematuria 06/23/2016   Acute kidney injury superimposed on chronic kidney disease ([place]) 06/23/2016   Near syncope 06/23/2016   Acute pulmonary edema ([place]) 06/17/2016   Non-rheumatic mitral regurgitation 06/17/2016   Ventricular tachycardia ([place]) 06/17/2016   Acute blood loss anemia 05/27/2016   Coronary artery disease involving native coronary artery of native heart without angina pectoris 05/27/2016   Acute inferolateral myocardial infarction  ([place]) 05/26/2016   Dyslipidemia 05/26/2016   Elevated serum creatinine 05/26/2016   Essential hypertension 05/26/2016   Hypothyroid 05/26/2016   Leukocytosis 05/26/2016   Myocardial infarction ([place]) 05/09/2016   Spider veins 04/07/2012   Tobacco abuse 04/07/2012   Varicose veins with pain 04/07/2012    Past Surgical History:  Procedure Laterality Date   CARDIAC SURGERY      OB History   No obstetric history on file.      Home Medications    Prior to Admission medications   Medication Sig Start Date End Date Taking? Authorizing Provider  HYDROcodone-acetaminophen (NORCO/VICODIN) 5-325 MG tablet Take 1-2 tablets by mouth every 6 (six) hours as needed. 09/08/20  Yes Raylene Everts, MD  predniSONE (DELTASONE) 20 MG tablet Take 1 tablet (20 mg total) by mouth 2 (two) times daily with a meal. 09/08/20  Yes Raylene Everts, MD  tiZANidine (ZANAFLEX) 4 MG tablet Take 1-2 tablets (4-8 mg total) by mouth every 6 (six) hours as needed for muscle spasms. 09/08/20  Yes Raylene Everts, MD  amLODipine (NORVASC) 10 MG tablet Take 10 mg by mouth daily. 03/23/19   [provider]  Aspirin Buf,CaCarb-MgCarb-MgO, 81 MG TABS Take by mouth.    [provider]  atorvastatin (LIPITOR) 20 MG tablet Take 20 mg by mouth daily. 02/24/19   [provider]  Cholecalciferol 125 MCG (5000 UT) TABS Take by mouth.  [provider]  gabapentin (NEURONTIN) 800 MG tablet Take 1 tablet (800 mg total) by mouth 3 (three) times daily. 06/05/20   Trula Slade, DPM  levothyroxine (SYNTHROID) 88 MCG tablet levothyroxine 88 mcg tablet    [provider]  lisinopril (ZESTRIL) 40 MG tablet lisinopril 40 mg tablet 01/18/19   [provider]  metoprolol succinate (TOPROL-XL) 50 MG 24 hr tablet metoprolol succinate ER 50 mg tablet,extended release 24 hr 02/19/19   [provider]  nitrofurantoin, macrocrystal-monohydrate, (MACROBID) 100 MG capsule  nitrofurantoin monohydrate/macrocrystals 100 mg capsule    [provider]  nitroGLYCERIN (NITROSTAT) 0.4 MG SL tablet nitroglycerin 0.4 mg sublingual tablet 09/26/17   [provider]  Omega-3 Fatty Acids (FISH OIL) 1000 MG CAPS Take by mouth.    [provider]    Family History Family History  Problem Relation Age of Onset   Cancer Mother    Cancer Father     Social History Social History   Tobacco Use   Smoking status: Every Day    Packs/day: 1.00    Years: 10.00    Pack years: 10.00    Types: Cigarettes   Smokeless tobacco: Never  Substance Use Topics   Alcohol use: Not Currently     Allergies   Patient has no known allergies.   Review of Systems Review of Systems See HPI  Physical Exam Triage Vital Signs ED Triage Vitals  Enc Vitals Group     BP 09/08/20 1757 (!) 156/86     Pulse Rate 09/08/20 1757 (!) 59     Resp 09/08/20 1757 17     Temp 09/08/20 1757 98.2 F (36.8 C)     Temp src --      SpO2 09/08/20 1757 98 %     Weight 09/08/20 1753 180 lb (81.6 kg)     Height 09/08/20 1753 '5\' 3"'$  (1.6 m)     Head Circumference --      Peak Flow --      Pain Score 09/08/20 1751 8     Pain Loc --      Pain Edu? --      Excl. in [place]? --    No data found.  Updated Vital Signs BP (!) 156/86 (BP Location: Right Arm)   Pulse (!) 59   Temp 98.2 F (36.8 C)   Resp 17   Ht '5\' 3"'$  (1.6 m)   Wt 81.6 kg   SpO2 98%   BMI 31.89 kg/m      Physical Exam Constitutional:      General: She is not in acute distress.    Appearance: She is well-developed and normal weight.  HENT:     Head: Normocephalic and atraumatic.     Mouth/Throat:     Comments: Mask is in place Eyes:     Conjunctiva/sclera: Conjunctivae normal.     Pupils: Pupils are equal, round, and reactive to light.  Cardiovascular:     Rate and Rhythm: Normal rate.  Pulmonary:     Effort: Pulmonary effort is normal. No respiratory distress.  Abdominal:     General: There is  no distension.     Palpations: Abdomen is soft.  Musculoskeletal:        General: Normal range of motion.     Cervical back: Normal range of motion.     Comments: Appears uncomfortable.  Guarded movements.  Full but slow range of motion.  Tenderness in the left L5-S1 region.  No  tenderness over spinous process or posterior pelvis.  Reflexes are 2+ in the right knee, 1+ in the left knee, trace in both ankles.  Straight leg raise on the left is positive.  Extension for increased buttock pain.  No numbness.  No weakness identified  Skin:    General: Skin is warm and dry.  Neurological:     General: No focal deficit present.     Mental Status: She is alert.     Sensory: No sensory deficit.     Motor: No weakness.     Coordination: Coordination normal.     Gait: Gait normal.     Deep Tendon Reflexes: Reflexes normal.  Psychiatric:        Mood and Affect: Mood normal.        Behavior: Behavior normal.     UC Treatments / Results  Labs (all labs ordered are listed, but only abnormal results are displayed) Labs Reviewed - No data to display  EKG   Radiology DG Lumbar Spine Complete  Result Date: 09/08/2020 CLINICAL DATA:  Left-sided sciatica for 1 month.  No known injury. EXAM: LUMBAR SPINE - COMPLETE 4+ VIEW COMPARISON:  None. FINDINGS: Five non rib-bearing lumbar vertebra with diminutive ribs at T12. 5 mm anterolisthesis of L4 on L5. Disc space narrowing and endplate spurring 624THL, L3-L4, and L4-L5. Multilevel facet hypertrophy most prominent at L4-L5 and L5-S1. Vertebral body heights are normal. No evidence of fracture, focal bone lesion or bone destruction. The sacroiliac joints are congruent. Aorto bi-iliac atherosclerosis. IMPRESSION: 1. Multilevel degenerative disc disease and facet hypertrophy. 2. Grade 1 anterolisthesis of L4 on L5, likely facet mediated. 3. Aorto bi-iliac atherosclerosis. Electronically Signed   By: Keith Rake M.D.   On: 09/08/2020 19:15     Procedures Procedures (including critical care time)  Medications Ordered in UC Medications  ketorolac (TORADOL) 30 MG/ML injection 30 mg (30 mg Intramuscular Given 09/08/20 1943)    Initial Impression / Assessment and Plan / UC Course  I have reviewed the triage vital signs and the nursing notes.  Pertinent labs & imaging results that were available during my care of the patient were reviewed by me and considered in my medical decision making (see chart for details).     I informed the patient that she has degenerative disc disease in her lumbar spine with some lumbar spondylosis.  This is likely causing her left sided sciatica.  She does not have any red flags for underlying pathology.  She has had a GI bleed so anti-inflammatory medicine such as ibuprofen are not indicated.  I will give her steroids at a moderate dose for a few days.  I worry about her history of heart disease.  She is also prescribed tizanidine and hydrocodone to take as needed.  She is to follow-up with primary care if she fails to improve, may need physical therapy or additional imaging She also has a history of atherosclerotic vascular disease.  Not surprisingly she has atherosclerosis of her abdominal aorta.  I explained this to her.  I encouraged her to follow-up with her cardiologist and primary care.  I did mention that she needs to quit smoking. 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  This is useful at bedtime Take hydrocodone as needed for severe pain.  Do not drive on hydrocodone.  This will not be refilled Follow-up with primary care Call or return if not improving in a week     ED Prescriptions     Medication Sig  Dispense Auth. Provider   predniSONE (DELTASONE) 20 MG tablet Take 1 tablet (20 mg total) by mouth 2 (two) times daily with a meal. 10 tablet Raylene Everts, MD   HYDROcodone-acetaminophen (NORCO/VICODIN) 5-325 MG tablet Take 1-2 tablets by mouth every 6 (six) hours as needed. 12 tablet Raylene Everts, MD   tiZANidine (ZANAFLEX) 4 MG tablet Take 1-2 tablets (4-8 mg total) by mouth every 6 (six) hours as needed for muscle spasms. 21 tablet Raylene Everts, MD      I have reviewed the PDMP during this encounter.   Raylene Everts, MD 09/08/20 [phone]

## 2020-09-08 NOTE — ED Triage Notes (Addendum)
Pt presents with Pain in L hip radiating to L ankle. Pt also st she has been having bilateral ankle swelling. Pt has tried tylenol for the pain with little relief and has not tried cold or heat application. Pt can not recall a instance where she could have pulled a muscle or hurt it. Pain as been going on for 3 weeks.

## 2020-09-08 NOTE — Discharge Instructions (Addendum)
Ice or heat to painful back muscles Take the prednisone 2 times a day for 5 days Take the tizanidine as needed as muscle relaxer.  This is useful at bedtime Take hydrocodone as needed for severe pain.  Do not drive on hydrocodone.  This will not be refilled Follow-up with primary care Call or return if not improving in a week

## 2020-09-09 ENCOUNTER — Encounter: Payer: Self-pay | Admitting: Medical-Surgical

## 2020-09-09 ENCOUNTER — Encounter: Payer: Self-pay | Admitting: Podiatry

## 2020-09-11 ENCOUNTER — Telehealth: Payer: Self-pay

## 2020-09-11 NOTE — Telephone Encounter (Signed)
Pt calls to request refills of medications for back pain that were prescribed by Dr. Meda Coffee at visit to [facility] on 09/08/20. Advised that providers here do not call in refills, that she needs to f/u w/ PCP. Pt states that Dr. Meda Coffee told her to call her if she needed refills. Informed that Dr. Meda Coffee is not here today; advised to come in for reevaluation or see PCP, or call back on 09/13/20 to discuss w/ Dr. Meda Coffee. She verbalizes understanding.

## 2020-09-12 ENCOUNTER — Ambulatory Visit: Payer: Medicare Other | Admitting: Podiatry

## 2020-09-13 ENCOUNTER — Telehealth: Payer: Self-pay | Admitting: Emergency Medicine

## 2020-09-13 NOTE — Telephone Encounter (Signed)
Patient called requesting a refill on her meds that Dr Meda Coffee prescribed for her at her last visit.  Advised pt that per Dr Francesca Oman note, it states that she will not refill any of the medications.  Patient will need to follow up with her PCP.  Patient's follow up with her PCP is not until 10/06/2020.  Please advise.

## 2020-09-13 NOTE — Telephone Encounter (Signed)
Patient informed and would like to have the Zanaflex called into [facility].

## 2020-10-06 ENCOUNTER — Ambulatory Visit (INDEPENDENT_AMBULATORY_CARE_PROVIDER_SITE_OTHER): Payer: Medicare Other | Admitting: Medical-Surgical

## 2020-10-06 ENCOUNTER — Encounter: Payer: Self-pay | Admitting: Medical-Surgical

## 2020-10-06 ENCOUNTER — Other Ambulatory Visit: Payer: Self-pay

## 2020-10-06 VITALS — BP 111/66 | HR 64 | Temp 98.2°F | Ht 61.75 in | Wt 159.7 lb

## 2020-10-06 DIAGNOSIS — I10 Essential (primary) hypertension: Secondary | ICD-10-CM | POA: Diagnosis not present

## 2020-10-06 DIAGNOSIS — Z7689 Persons encountering health services in other specified circumstances: Secondary | ICD-10-CM

## 2020-10-06 DIAGNOSIS — E785 Hyperlipidemia, unspecified: Secondary | ICD-10-CM | POA: Diagnosis not present

## 2020-10-06 DIAGNOSIS — E039 Hypothyroidism, unspecified: Secondary | ICD-10-CM | POA: Diagnosis not present

## 2020-10-06 DIAGNOSIS — Z23 Encounter for immunization: Secondary | ICD-10-CM | POA: Diagnosis not present

## 2020-10-06 DIAGNOSIS — F419 Anxiety disorder, unspecified: Secondary | ICD-10-CM

## 2020-10-06 MED ORDER — BUSPIRONE HCL 5 MG PO TABS: 5.0000 mg | ORAL_TABLET | Freq: Two times a day (BID) | ORAL | 1 refills | Status: DC | PRN

## 2020-10-06 MED ORDER — TIZANIDINE HCL 4 MG PO TABS: 4.0000 mg | ORAL_TABLET | Freq: Four times a day (QID) | ORAL | 0 refills | Status: DC | PRN

## 2020-10-06 MED ORDER — MELOXICAM 15 MG PO TABS: 15.0000 mg | ORAL_TABLET | Freq: Every day | ORAL | 0 refills | Status: DC

## 2020-10-06 NOTE — Patient Instructions (Signed)
Influenza (Flu) Vaccine (Inactivated or Recombinant): What You Need to Know 1. Why get vaccinated? Influenza vaccine can prevent influenza (flu). Flu is a contagious disease that spreads around the [place] every year, usually between October and May. Anyone can get the flu, but it is more dangerous for some people. Infants and young children, people 65 years and older, pregnant people, and people with certain health conditions or a weakened immune system are at greatest risk of flu complications. Pneumonia, bronchitis, sinus infections, and ear infections are examples of flu-related complications. If you have a medical condition, such as heart disease, cancer, or diabetes, flu can make it worse. Flu can cause fever and chills, sore throat, muscle aches, fatigue, cough, headache, and runny or stuffy nose. Some people may have vomiting and diarrhea, though this is more common in children than adults. In an average year, thousands of people in the [place] die from flu, and many more are hospitalized. Flu vaccine prevents millions of illnesses and flu-related visits to the doctor each year. 2. Influenza vaccines CDC recommends everyone 6 months and older get vaccinated every flu season. Children 6 months through 8 years of age may need 2 doses during a single flu season. Everyone else needs only 1 dose each flu season. It takes about 2 weeks for protection to develop after vaccination. There are many flu viruses, and they are always changing. Each year a new flu vaccine is made to protect against the influenza viruses believed to be likely to cause disease in the upcoming flu season. Even when the vaccine doesn't exactly match these viruses, it may still provide some protection. Influenza vaccine does not cause flu. Influenza vaccine may be given at the same time as other vaccines. 3. Talk with your health care provider Tell your vaccination provider if the person getting the vaccine: Has had  an allergic reaction after a previous dose of influenza vaccine, or has any severe, life-threatening allergies Has ever had Guillain-Barr Syndrome (also called "GBS") In some cases, your health care provider may decide to postpone influenza vaccination until a future visit. Influenza vaccine can be administered at any time during pregnancy. People who are or will be pregnant during influenza season should receive inactivated influenza vaccine. People with minor illnesses, such as a cold, may be vaccinated. People who are moderately or severely ill should usually wait until they recover before getting influenza vaccine. Your health care provider can give you more information. 4. Risks of a vaccine reaction Soreness, redness, and swelling where the shot is given, fever, muscle aches, and headache can happen after influenza vaccination. There may be a very small increased risk of Guillain-Barr Syndrome (GBS) after inactivated influenza vaccine (the flu shot). Young children who get the flu shot along with pneumococcal vaccine (PCV13) and/or DTaP vaccine at the same time might be slightly more likely to have a seizure caused by fever. Tell your health care provider if a child who is getting flu vaccine has ever had a seizure. People sometimes faint after medical procedures, including vaccination. Tell your provider if you feel dizzy or have vision changes or ringing in the ears. As with any medicine, there is a very remote chance of a vaccine causing a severe allergic reaction, other serious injury, or death. 5. What if there is a serious problem? An allergic reaction could occur after the vaccinated person leaves the clinic. If you see signs of a severe allergic reaction (hives, swelling of the face and throat, difficulty breathing,   a fast heartbeat, dizziness, or weakness), call 9-1-1 and get the person to the nearest hospital. For other signs that concern you, call your health care provider. Adverse  reactions should be reported to the Vaccine Adverse Event Reporting System (VAERS). Your health care provider will usually file this report, or you can do it yourself. Visit the VAERS website at www.vaers.hhs.gov or call 1-800-822-7967. VAERS is only for reporting reactions, and VAERS staff members do not give medical advice. 6. The National Vaccine Injury Compensation Program The National Vaccine Injury Compensation Program (VICP) is a federal program that was created to compensate people who may have been injured by certain vaccines. Claims regarding alleged injury or death due to vaccination have a time limit for filing, which may be as short as two years. Visit the VICP website at www.hrsa.gov/vaccinecompensation or call 1-800-338-2382 to learn about the program and about filing a claim. 7. How can I learn more? Ask your health care provider. Call your local or state health department. Visit the website of the Food and Drug Administration (FDA) for vaccine package inserts and additional information at www.fda.gov/vaccines-blood-biologics/vaccines. Contact the Centers for Disease Control and Prevention (CDC): Call 1-800-232-4636 (1-800-CDC-INFO) or Visit CDC's website at www.cdc.gov/flu. Vaccine Information Statement Inactivated Influenza Vaccine (09/14/2019) This information is not intended to replace advice given to you by your health care provider. Make sure you discuss any questions you have with your health care provider. Document Revised: 11/01/2019 Document Reviewed: 11/01/2019 Elsevier Patient Education  2022 Elsevier Inc.  

## 2020-10-06 NOTE — Progress Notes (Signed)
New Patient Office Visit  Subjective:  Patient ID: Donna Hester, female    DOB: 1951-01-27  Age: 70 y.o. MRN: [id number]  CC:  Chief Complaint  Patient presents with   Establish Care    HPI Selen Fess presents to establish care.   Hypertension-taking amlodipine 10 mg, lisinopril 40 mg, and metoprolol 50 mg daily as prescribed, tolerating well without side effects.  She does occasionally have some bilateral lower extremity edema but she takes furosemide 40 mg daily as needed for this which is helpful.  Hyperlipidemia-taking atorvastatin 20 mg daily, tolerating well without side effects.  Neuropathy-was told by her podiatrist that she does have neuropathy.  She takes gabapentin 800 mg once daily in the evenings but does not notice that this makes a big difference.  Hypothyroidism-taking levothyroxine 88 mcg daily, tolerating well without side effects.  Back pain-was seen in urgent care for low back pain with pain/numbness/tingling extending down her leg.  She was treated with a burst of prednisone, short course of Norco, and tizanidine.  Notes that the prednisone did not make any difference and she did not want to be on the Norco any longer because it caused severe constipation.  She did use tizanidine 8 mg every 6 hours as needed which was somewhat helpful.  No saddle paresthesias, new onset incontinence, or weakness of the lower extremities.  Anxiety-was treated in the past with diazepam 5 mg every 8 hours as needed for anxiety.  She has never been treated with a controller medicine such as an SSRI.  Is requesting to have a refill of diazepam because she does get shaky at times when she is stressed.  Past Medical History:  Diagnosis Date   CHF (congestive heart failure) (HCC)    Coronary artery disease     Past Surgical History:  Procedure Laterality Date   CARDIAC SURGERY      Family History  Problem Relation Age of Onset   Cancer Mother    Cancer Father      Social History   Socioeconomic History   Marital status: Married    Spouse name: Not on file   Number of children: Not on file   Years of education: Not on file   Highest education level: Not on file  Occupational History   Not on file  Tobacco Use   Smoking status: Every Day    Packs/day: 1.00    Years: 10.00    Pack years: 10.00    Types: Cigarettes   Smokeless tobacco: Never  Substance and Sexual Activity   Alcohol use: Not Currently   Drug use: Never   Sexual activity: Yes  Other Topics Concern   Not on file  Social History Narrative   Not on file   Social Determinants of Health   Financial Resource Strain: Not on file  Food Insecurity: Not on file  Transportation Needs: Not on file  Physical Activity: Not on file  Stress: Not on file  Social Connections: Not on file  Intimate Partner Violence: Not on file    ROS Review of Systems  Constitutional:  Negative for chills, fatigue, fever and unexpected weight change.  HENT:  Negative for congestion, rhinorrhea, sinus pressure and sore throat.   Respiratory:  Negative for cough, chest tightness and shortness of breath.   Cardiovascular:  Positive for leg swelling (Bilateral foot and ankle). Negative for chest pain and palpitations.  Gastrointestinal:  Negative for abdominal pain, constipation, diarrhea, nausea and vomiting.  Endocrine: Negative for  cold intolerance and heat intolerance.  Genitourinary:  Negative for dysuria, frequency, urgency, vaginal bleeding and vaginal discharge.  Skin:  Negative for rash and wound.  Neurological:  Negative for dizziness, light-headedness and headaches.  Hematological:  Does not bruise/bleed easily.  Psychiatric/Behavioral:  Negative for dysphoric mood, self-injury, sleep disturbance and suicidal ideas. The patient is nervous/anxious.    Objective:   Today's Vitals: BP 111/66   Pulse 64   Temp 98.2 F (36.8 C)   Ht 5' 1.75" (1.568 m)   Wt 159 lb 11.2 oz (72.4 kg)    SpO2 98%   BMI 29.45 kg/m   Physical Exam Vitals reviewed.  Constitutional:      General: She is not in acute distress.    Appearance: Normal appearance. She is not ill-appearing.  HENT:     Head: Normocephalic and atraumatic.  Cardiovascular:     Rate and Rhythm: Normal rate and regular rhythm.     Pulses: Normal pulses.     Heart sounds: Normal heart sounds. No murmur heard.   No friction rub. No gallop.  Pulmonary:     Effort: Pulmonary effort is normal. No respiratory distress.     Breath sounds: Normal breath sounds. No wheezing.  Skin:    General: Skin is warm and dry.  Neurological:     Mental Status: She is alert and oriented to person, place, and time.  Psychiatric:        Mood and Affect: Mood normal.        Behavior: Behavior normal.        Thought Content: Thought content normal.        Judgment: Judgment normal.    Assessment & Plan:   1. Encounter to establish care Reviewed available information and discussed care concerns with patient.    2. Essential hypertension Checking CBC with differential and CMP. Continue amlodipine 10 mg, lisinopril 40 mg, and Toprol-XL 50 mg daily as prescribed.  Blood pressure today at goal. - CBC with Differential/Platelet - COMPLETE METABOLIC PANEL WITH GFR  3. Acquired hypothyroidism Checking TSH today.  Continue levothyroxine 88 mcg daily - TSH  4. Dyslipidemia Checking lipid panel today.  Continue atorvastatin 20 mg daily. - Lipid panel  5. Anxiety Discussed treatment with daily medication versus as needed.  Due to her age and current medications, avoiding diazepam.  We will go ahead and start BuSpar 5 mg twice daily as needed.  Would like her to consider starting something such as a low-dose of sertraline to see if this helps with her overall anxiety symptoms.  6. Need for influenza vaccination Flu vaccine given in office today. - Flu Vaccine QUAD High Dose(Fluad)    Outpatient Encounter Medications as of  10/06/2020  Medication Sig   amLODipine (NORVASC) 10 MG tablet Take 10 mg by mouth daily.   Aspirin Buf,CaCarb-MgCarb-MgO, 81 MG TABS Take by mouth.   atorvastatin (LIPITOR) 20 MG tablet Take 20 mg by mouth daily.   busPIRone (BUSPAR) 5 MG tablet Take 1 tablet (5 mg total) by mouth 2 (two) times daily as needed.   Cholecalciferol 125 MCG (5000 UT) TABS Take by mouth.   gabapentin (NEURONTIN) 800 MG tablet Take 1 tablet (800 mg total) by mouth 3 (three) times daily. (Patient taking differently: Take 800 mg by mouth daily.)   levothyroxine (SYNTHROID) 88 MCG tablet levothyroxine 88 mcg tablet   lisinopril (ZESTRIL) 40 MG tablet lisinopril 40 mg tablet   meloxicam (MOBIC) 15 MG tablet Take 1 tablet (15  mg total) by mouth daily.   metoprolol succinate (TOPROL-XL) 50 MG 24 hr tablet metoprolol succinate ER 50 mg tablet,extended release 24 hr   nitroGLYCERIN (NITROSTAT) 0.4 MG SL tablet nitroglycerin 0.4 mg sublingual tablet   Omega-3 Fatty Acids (FISH OIL) 1000 MG CAPS Take by mouth.   HYDROcodone-acetaminophen (NORCO/VICODIN) 5-325 MG tablet Take 1-2 tablets by mouth every 6 (six) hours as needed. (Patient not taking: Reported on 10/06/2020)   nitrofurantoin, macrocrystal-monohydrate, (MACROBID) 100 MG capsule nitrofurantoin monohydrate/macrocrystals 100 mg capsule (Patient not taking: Reported on 10/06/2020)   predniSONE (DELTASONE) 20 MG tablet Take 1 tablet (20 mg total) by mouth 2 (two) times daily with a meal. (Patient not taking: Reported on 10/06/2020)   tiZANidine (ZANAFLEX) 4 MG tablet Take 1-2 tablets (4-8 mg total) by mouth every 6 (six) hours as needed for muscle spasms.   [DISCONTINUED] tiZANidine (ZANAFLEX) 4 MG tablet Take 1-2 tablets (4-8 mg total) by mouth every 6 (six) hours as needed for muscle spasms. (Patient not taking: Reported on 10/06/2020)   No facility-administered encounter medications on file as of 10/06/2020.    Follow-up: Return in about 4 weeks (around 11/03/2020) for  mood/back pain follow up.   Samuel Bouche, NP

## 2020-10-07 ENCOUNTER — Encounter: Payer: Self-pay | Admitting: Medical-Surgical

## 2020-10-07 ENCOUNTER — Other Ambulatory Visit: Payer: Self-pay | Admitting: Medical-Surgical

## 2020-10-07 DIAGNOSIS — N179 Acute kidney failure, unspecified: Secondary | ICD-10-CM

## 2020-10-07 LAB — COMPLETE METABOLIC PANEL WITH GFR
AG Ratio: 1.7 (calc) (ref 1.0–2.5)
ALT: 19 U/L (ref 6–29)
AST: 13 U/L (ref 10–35)
Albumin: 4.1 g/dL (ref 3.6–5.1)
Alkaline phosphatase (APISO): 96 U/L (ref 37–153)
BUN/Creatinine Ratio: 18 (calc) (ref 6–22)
BUN: 23 mg/dL (ref 7–25)
CO2: 25 mmol/L (ref 20–32)
Calcium: 9.7 mg/dL (ref 8.6–10.4)
Chloride: 105 mmol/L (ref 98–110)
Creat: 1.31 mg/dL — ABNORMAL HIGH (ref 0.50–1.05)
Globulin: 2.4 g/dL (calc) (ref 1.9–3.7)
Glucose, Bld: 150 mg/dL — ABNORMAL HIGH (ref 65–99)
Potassium: 4.5 mmol/L (ref 3.5–5.3)
Sodium: 137 mmol/L (ref 135–146)
Total Bilirubin: 0.7 mg/dL (ref 0.2–1.2)
Total Protein: 6.5 g/dL (ref 6.1–8.1)
eGFR: 44 mL/min/{1.73_m2} — ABNORMAL LOW (ref >=60)

## 2020-10-07 LAB — CBC WITH DIFFERENTIAL/PLATELET
Absolute Monocytes: 830 cells/uL (ref 200–950)
Basophils Absolute: 63 cells/uL (ref 0–200)
Basophils Relative: 0.6 %
Eosinophils Absolute: 221 cells/uL (ref 15–500)
Eosinophils Relative: 2.1 %
HCT: 46.6 % — ABNORMAL HIGH (ref 35.0–45.0)
Hemoglobin: 16.3 g/dL — ABNORMAL HIGH (ref 11.7–15.5)
Lymphs Abs: 2783 cells/uL (ref 850–3900)
MCH: 33 pg (ref 27.0–33.0)
MCHC: 35 g/dL (ref 32.0–36.0)
MCV: 94.3 fL (ref 80.0–100.0)
MPV: 10.5 fL (ref 7.5–12.5)
Monocytes Relative: 7.9 %
Neutro Abs: 6605 cells/uL (ref 1500–7800)
Neutrophils Relative %: 62.9 %
Platelets: 281 10*3/uL (ref 140–400)
RBC: 4.94 10*6/uL (ref 3.80–5.10)
RDW: 13.4 % (ref 11.0–15.0)
Total Lymphocyte: 26.5 %
WBC: 10.5 10*3/uL (ref 3.8–10.8)

## 2020-10-07 LAB — LIPID PANEL
Cholesterol: 162 mg/dL (ref <200)
HDL: 48 mg/dL — ABNORMAL LOW (ref >=50)
LDL Cholesterol (Calc): 86 mg/dL (calc)
Non-HDL Cholesterol (Calc): 114 mg/dL (calc) (ref <130)
Total CHOL/HDL Ratio: 3.4 (calc) (ref <5.0)
Triglycerides: 186 mg/dL — ABNORMAL HIGH (ref <150)

## 2020-10-07 LAB — TSH: TSH: 1.35 mIU/L (ref 0.40–4.50)

## 2020-10-27 ENCOUNTER — Encounter: Payer: Self-pay | Admitting: Medical-Surgical

## 2020-10-27 ENCOUNTER — Other Ambulatory Visit: Payer: Self-pay

## 2020-10-27 ENCOUNTER — Ambulatory Visit (INDEPENDENT_AMBULATORY_CARE_PROVIDER_SITE_OTHER): Payer: Medicare Other | Admitting: Medical-Surgical

## 2020-10-27 VITALS — BP 137/71 | HR 63 | Resp 20 | Ht 61.75 in | Wt 162.0 lb

## 2020-10-27 DIAGNOSIS — Z23 Encounter for immunization: Secondary | ICD-10-CM | POA: Diagnosis not present

## 2020-10-27 DIAGNOSIS — F419 Anxiety disorder, unspecified: Secondary | ICD-10-CM | POA: Diagnosis not present

## 2020-10-27 DIAGNOSIS — G8929 Other chronic pain: Secondary | ICD-10-CM | POA: Diagnosis not present

## 2020-10-27 DIAGNOSIS — M545 Low back pain, unspecified: Secondary | ICD-10-CM

## 2020-10-27 MED ORDER — SHINGRIX 50 MCG/0.5ML IM SUSR: 0.5000 mL | Freq: Once | INTRAMUSCULAR | 1 refills | Status: AC

## 2020-10-27 MED ORDER — LEVOTHYROXINE SODIUM 88 MCG PO TABS: ORAL_TABLET | ORAL | 3 refills | Status: DC

## 2020-10-27 MED ORDER — MELOXICAM 15 MG PO TABS: 15.0000 mg | ORAL_TABLET | Freq: Every day | ORAL | 0 refills | Status: DC

## 2020-10-27 MED ORDER — TIZANIDINE HCL 4 MG PO TABS: 4.0000 mg | ORAL_TABLET | Freq: Three times a day (TID) | ORAL | 0 refills | Status: DC | PRN

## 2020-10-27 MED ORDER — LEVOTHYROXINE SODIUM 88 MCG PO TABS: 88.0000 ug | ORAL_TABLET | Freq: Every day | ORAL | 3 refills | Status: DC

## 2020-10-27 NOTE — Progress Notes (Signed)
  HPI with pertinent ROS:   CC: Mood, back pain  HPI: Pleasant 70 year old female presenting today for mood and back pain follow-up.  Mood-has been taking BuSpar 5 mg as needed, tolerating well without side effects.  Reports she has only had to take few doses of this but it was very helpful when she did.  Denies SI/HI.  Back pain-taking meloxicam 15 mg daily and tizanidine 4 mg every 8 hours as needed.  Tolerating both medications well without side effects.  Notes her back pain is some better and there are times when it does not hurt at all.  Every now and then she will have a period of pain that is resolved with rest.  I reviewed the past medical history, family history, social history, surgical history, and allergies today and no changes were needed.  Please see the problem list section below in epic for further details.   Physical exam:   General: Well Developed, well nourished, and in no acute distress.  Neuro: Alert and oriented x3.  HEENT: Normocephalic, atraumatic.  Skin: Warm and dry. Cardiac: Regular rate and rhythm, no murmurs rubs or gallops, no lower extremity edema.  Respiratory: Clear to auscultation bilaterally. Not using accessory muscles, speaking in full sentences.  Impression and Recommendations:    1. Anxiety Stable.  Continue BuSpar 5 mg 3 times daily as needed.  2. Need for shingles vaccine Printed prescription for Shingrix vaccines provided to patient with instructions to present this to her pharmacist.  3. Chronic bilateral low back pain without sciatica Continue meloxicam 15 mg daily as needed.  Continue tizanidine 4 mg every 8 hours as needed.  Refills of medications sent to pharmacy at her request.  Return in about 3 months (around 01/26/2021) for mood follow up. ___________________________________________ Clearnce Sorrel, DNP, APRN, FNP-BC [facility] [place]

## 2020-10-28 ENCOUNTER — Other Ambulatory Visit: Payer: Self-pay

## 2020-10-28 MED ORDER — LEVOTHYROXINE SODIUM 88 MCG PO TABS: 88.0000 ug | ORAL_TABLET | Freq: Every day | ORAL | 3 refills | Status: DC

## 2020-10-31 ENCOUNTER — Encounter: Payer: Self-pay | Admitting: Podiatry

## 2020-10-31 ENCOUNTER — Ambulatory Visit (INDEPENDENT_AMBULATORY_CARE_PROVIDER_SITE_OTHER): Payer: Medicare Other | Admitting: Podiatry

## 2020-10-31 ENCOUNTER — Other Ambulatory Visit: Payer: Self-pay

## 2020-10-31 DIAGNOSIS — G629 Polyneuropathy, unspecified: Secondary | ICD-10-CM

## 2020-10-31 DIAGNOSIS — L600 Ingrowing nail: Secondary | ICD-10-CM | POA: Diagnosis not present

## 2020-10-31 DIAGNOSIS — D492 Neoplasm of unspecified behavior of bone, soft tissue, and skin: Secondary | ICD-10-CM

## 2020-10-31 DIAGNOSIS — B351 Tinea unguium: Secondary | ICD-10-CM | POA: Diagnosis not present

## 2020-11-05 NOTE — Progress Notes (Signed)
Subjective: 70 year old female presents the office today for follow-up evaluation after undergoing left nail avulsions to digits 3, 4, 5.  She states they are healing well any side effects.  Gets a callus on the right foot causing some occasional discomfort.  No swelling or redness or any drainage.  No open sores.  No recent injury.  No other concerns.  Objective: AAO x3, NAD DP/PT pulses palpable bilaterally, CRT less than 3 seconds Status post total nail avulsions of digits 3, 4, 5 bilaterally.  Wounds of healed.  Small mount of the callus, scab is present on the left side but there is no edema, erythema, drainage or pus from signs of infection. Minimal hyperkeratotic tissue present on the right submetatarsal area without any underlying ulceration drainage or any signs of infection. No pain with calf compression, swelling, warmth, erythema  Assessment: Hyperkeratotic lesion, onychodystrophy  Plan: -All treatment options discussed with the patient including all alternatives, risks, complications.  -Debrided the hyperkeratotic lesion without any complications or bleeding.  Continue moisturizer and offloading daily. -Procedure sites appear to be healed.  Monitor for any signs or symptoms of infection. -Patient encouraged to call the office with any questions, concerns, change in symptoms.

## 2020-11-10 ENCOUNTER — Encounter: Payer: Self-pay | Admitting: Medical-Surgical

## 2020-11-10 ENCOUNTER — Other Ambulatory Visit: Payer: Self-pay

## 2020-11-10 DIAGNOSIS — E039 Hypothyroidism, unspecified: Secondary | ICD-10-CM

## 2020-11-10 MED ORDER — LEVOTHYROXINE SODIUM 88 MCG PO TABS: 88.0000 ug | ORAL_TABLET | Freq: Every day | ORAL | 3 refills | Status: DC

## 2020-11-11 ENCOUNTER — Other Ambulatory Visit: Payer: Self-pay | Admitting: Medical-Surgical

## 2020-11-11 MED ORDER — VITAMIN D3 50 MCG (2000 UT) PO CAPS: 2000.0000 [IU] | ORAL_CAPSULE | Freq: Every day | ORAL | 3 refills | Status: AC

## 2020-12-16 ENCOUNTER — Encounter: Payer: Self-pay | Admitting: Podiatry

## 2020-12-16 ENCOUNTER — Ambulatory Visit: Payer: Medicare Other

## 2020-12-16 ENCOUNTER — Ambulatory Visit (INDEPENDENT_AMBULATORY_CARE_PROVIDER_SITE_OTHER): Payer: Medicare Other

## 2020-12-16 ENCOUNTER — Ambulatory Visit (INDEPENDENT_AMBULATORY_CARE_PROVIDER_SITE_OTHER): Payer: Medicare Other | Admitting: Podiatry

## 2020-12-16 ENCOUNTER — Other Ambulatory Visit: Payer: Self-pay

## 2020-12-16 DIAGNOSIS — M79672 Pain in left foot: Secondary | ICD-10-CM | POA: Diagnosis not present

## 2020-12-16 DIAGNOSIS — M2042 Other hammer toe(s) (acquired), left foot: Secondary | ICD-10-CM | POA: Diagnosis not present

## 2020-12-16 DIAGNOSIS — M2041 Other hammer toe(s) (acquired), right foot: Secondary | ICD-10-CM | POA: Diagnosis not present

## 2020-12-16 DIAGNOSIS — M21612 Bunion of left foot: Secondary | ICD-10-CM

## 2020-12-16 DIAGNOSIS — M79671 Pain in right foot: Secondary | ICD-10-CM

## 2020-12-16 NOTE — Patient Instructions (Signed)

## 2020-12-17 ENCOUNTER — Encounter: Payer: Self-pay | Admitting: Podiatry

## 2020-12-17 NOTE — Telephone Encounter (Signed)
Please document in chart,thanks

## 2020-12-17 NOTE — Telephone Encounter (Signed)
Please advise 

## 2020-12-17 NOTE — Progress Notes (Signed)
Subjective: 70 year old female presents the office today for surgical consultation given pain to her left foot.  Majority discomfort on the second digit works contracted causing irritation side shoes as well as pain.  She tried shoe modifications, offloading padding and significant improvement.  She was discussed surgical options in more detail today.  Objective: AAO x3, NAD DP/PT pulses palpable bilaterally, CRT less than 3 seconds Moderate bunion is present with abductus of the hallux.  On the left foot there is significant hammertoe contracture present second digit and second overlapping hallux.  There is erythema of the dorsal PIPJ from irritation/issues on the second digit.  There is no skin breakdown.  There is no open lesions otherwise. There is prominence of metatarsal heads plantarly with atrophy of the fat pad in the right foot. No pain with calf compression, swelling, warmth, erythema  Assessment: Left foot significant hammertoe deformity, bunion  Plan: -All treatment options discussed with the patient including all alternatives, risks, complications.  -X-rays were obtained and reviewed of the left foot.  Moderate bunion is present.  Significant hammertoe contracture present of the second digit. -In regards to left foot we discussed with conservative as well as surgical treatment options.  This point she wants to discuss surgical intervention as she has tried numerous conservative treatments including shoe modification, offloading, padding without any improvement.  I discussed with her would likely need to fix the bunion in order to get the second toe corrected.  Discussed with her also bunionectomy, second hammertoe repair as well as shortening of the second metatarsal.  She wishes to proceed with this. -The incision placement as well as the postoperative course was discussed with the patient. I discussed risks of the surgery which include, but not limited to, infection, bleeding, pain,  swelling, need for further surgery, delayed or nonhealing, painful or ugly scar, numbness or sensation changes, over/under correction, recurrence, transfer lesions, further deformity, hardware failure, DVT/PE, loss of toe/foot. Patient understands these risks and wishes to proceed with surgery. The surgical consent was reviewed with the patient all 3 pages were signed. No promises or guarantees were given to the outcome of the procedure. All questions were answered to the best of my ability. Before the surgery the patient was encouraged to call the office if there is any further questions. The surgery will be performed at the [facility] on an outpatient basis. -ABI ordered for preoperative assessment. -No significant callus to debride on the right foot today. -We will need cardiac, PCP clearance.   Trula Slade DPM

## 2020-12-18 ENCOUNTER — Telehealth: Payer: Self-pay | Admitting: Podiatry

## 2020-12-18 ENCOUNTER — Encounter: Payer: Self-pay | Admitting: Podiatry

## 2020-12-18 NOTE — Telephone Encounter (Signed)
Daughter Trinna Post called to find out when patient is to have circulation test done? She is under the impression it is going to be done before her mothers procedure ? Please advise

## 2020-12-19 NOTE — Addendum Note (Signed)
Addended by: Celesta Gentile R on: 12/19/2020 05:55 AM   Modules accepted: Orders

## 2020-12-19 NOTE — Telephone Encounter (Signed)
Daughter Trinna Post called she wants to have the circulation test scheduled in [place] at [facility] -  fax number [phone]  phone [phone]

## 2020-12-22 ENCOUNTER — Encounter: Payer: Self-pay | Admitting: Medical-Surgical

## 2020-12-22 ENCOUNTER — Other Ambulatory Visit: Payer: Self-pay

## 2020-12-22 ENCOUNTER — Ambulatory Visit (INDEPENDENT_AMBULATORY_CARE_PROVIDER_SITE_OTHER): Payer: Medicare Other | Admitting: Medical-Surgical

## 2020-12-22 ENCOUNTER — Other Ambulatory Visit: Payer: Self-pay | Admitting: *Deleted

## 2020-12-22 ENCOUNTER — Telehealth: Payer: Self-pay | Admitting: *Deleted

## 2020-12-22 VITALS — BP 136/78 | HR 61 | Resp 20 | Ht 61.0 in | Wt 164.0 lb

## 2020-12-22 DIAGNOSIS — F419 Anxiety disorder, unspecified: Secondary | ICD-10-CM

## 2020-12-22 DIAGNOSIS — Z01818 Encounter for other preprocedural examination: Secondary | ICD-10-CM

## 2020-12-22 NOTE — Telephone Encounter (Signed)
Called and faxed referral to [facility] [place] without TBI to [facility] per patient's daughter's request, received confirmation of fax-12/22/20. They will contact to schedule appointment.

## 2020-12-22 NOTE — Telephone Encounter (Signed)
Talked to [name] about the circulation test being done at [facility] and I re-did the order and [name] was going to send the order over today. Donna Hester

## 2020-12-22 NOTE — Progress Notes (Signed)
  HPI with pertinent ROS:   CC: Preoperative clearance, mood follow-up  HPI: Pleasant 70 year old female presenting today for the following:  Mood follow-up-doing well with the use of BuSpar 5 mg as needed.  Notes that she only takes this occasionally and it does help to take the edge off her anxiety.  Denies SI/HI.  Preoperative clearance-will be having her left hammertoe surgery at the end of the month.  Has already seen cardiology and received cardiology clearance however they do want her to continue her aspirin without interruption.  I reviewed the past medical history, family history, social history, surgical history, and allergies today and no changes were needed.  Please see the problem list section below in epic for further details.   Physical exam:   General: Well Developed, well nourished, and in no acute distress.  Neuro: Alert and oriented x3.  HEENT: Normocephalic, atraumatic.  Skin: Warm and dry. Cardiac: Regular rate and rhythm, no murmurs rubs or gallops, no lower extremity edema.  Respiratory: Clear to auscultation bilaterally. Not using accessory muscles, speaking in full sentences.  Impression and Recommendations:    1. Preoperative clearance In office EKG- rate 55, sinus bradycardia, normal axis Checking CBC, CMP, PT/INR, and PTT.  If no significant abnormalities, surgical clearance for primary care granted.  She is considered low risk for complications for this procedure.  Per cardiology note on 12/19/2020, she is low risk for perioperative cardiac complications however she should continue her low-dose aspirin without interruption.  2. Anxiety Continue BuSpar prn.  Return in about 3 months (around 03/24/2021) for chronic disease follow up. ___________________________________________ Clearnce Sorrel, DNP, APRN, FNP-BC [facility] [place]

## 2020-12-22 NOTE — Addendum Note (Signed)
Addended by: Cranford Mon R on: 12/22/2020 09:30 AM   Modules accepted: Orders

## 2020-12-23 ENCOUNTER — Encounter: Payer: Self-pay | Admitting: Medical-Surgical

## 2020-12-23 LAB — COMPLETE METABOLIC PANEL WITH GFR
AG Ratio: 1.7 (calc) (ref 1.0–2.5)
ALT: 13 U/L (ref 6–29)
AST: 14 U/L (ref 10–35)
Albumin: 4.3 g/dL (ref 3.6–5.1)
Alkaline phosphatase (APISO): 95 U/L (ref 37–153)
BUN/Creatinine Ratio: 16 (calc) (ref 6–22)
BUN: 21 mg/dL (ref 7–25)
CO2: 26 mmol/L (ref 20–32)
Calcium: 9.8 mg/dL (ref 8.6–10.4)
Chloride: 106 mmol/L (ref 98–110)
Creat: 1.28 mg/dL — ABNORMAL HIGH (ref 0.50–1.05)
Globulin: 2.6 g/dL (calc) (ref 1.9–3.7)
Glucose, Bld: 78 mg/dL (ref 65–99)
Potassium: 4.7 mmol/L (ref 3.5–5.3)
Sodium: 140 mmol/L (ref 135–146)
Total Bilirubin: 0.7 mg/dL (ref 0.2–1.2)
Total Protein: 6.9 g/dL (ref 6.1–8.1)
eGFR: 46 mL/min/{1.73_m2} — ABNORMAL LOW (ref >=60)

## 2020-12-23 LAB — CBC
HCT: 45.6 % — ABNORMAL HIGH (ref 35.0–45.0)
Hemoglobin: 15.7 g/dL — ABNORMAL HIGH (ref 11.7–15.5)
MCH: 32.7 pg (ref 27.0–33.0)
MCHC: 34.4 g/dL (ref 32.0–36.0)
MCV: 95 fL (ref 80.0–100.0)
MPV: 10.3 fL (ref 7.5–12.5)
Platelets: 253 10*3/uL (ref 140–400)
RBC: 4.8 10*6/uL (ref 3.80–5.10)
RDW: 13.5 % (ref 11.0–15.0)
WBC: 12.5 10*3/uL — ABNORMAL HIGH (ref 3.8–10.8)

## 2020-12-23 LAB — CP4508-PT/INR AND PTT
INR: 1
Prothrombin Time: 10 s (ref 9.0–11.5)
aPTT: 26 s (ref 23–32)

## 2020-12-25 ENCOUNTER — Ambulatory Visit (HOSPITAL_COMMUNITY): Admission: RE | Admit: 2020-12-25 | Payer: Medicare Other | Source: Ambulatory Visit

## 2020-12-31 ENCOUNTER — Encounter: Payer: Self-pay | Admitting: Podiatry

## 2020-12-31 ENCOUNTER — Other Ambulatory Visit: Payer: Self-pay | Admitting: Podiatry

## 2020-12-31 DIAGNOSIS — I739 Peripheral vascular disease, unspecified: Secondary | ICD-10-CM

## 2021-01-05 ENCOUNTER — Encounter: Payer: Self-pay | Admitting: Podiatry

## 2021-01-07 ENCOUNTER — Encounter: Payer: Self-pay | Admitting: Podiatry

## 2021-01-07 ENCOUNTER — Other Ambulatory Visit: Payer: Self-pay | Admitting: Podiatry

## 2021-01-07 DIAGNOSIS — M2012 Hallux valgus (acquired), left foot: Secondary | ICD-10-CM | POA: Diagnosis not present

## 2021-01-07 DIAGNOSIS — M2042 Other hammer toe(s) (acquired), left foot: Secondary | ICD-10-CM | POA: Diagnosis not present

## 2021-01-07 DIAGNOSIS — M21542 Acquired clubfoot, left foot: Secondary | ICD-10-CM | POA: Diagnosis not present

## 2021-01-07 MED ORDER — OXYCODONE-ACETAMINOPHEN 5-325 MG PO TABS: 1.0000 | ORAL_TABLET | ORAL | 0 refills | Status: DC | PRN

## 2021-01-07 MED ORDER — PROMETHAZINE HCL 25 MG PO TABS: 25.0000 mg | ORAL_TABLET | Freq: Three times a day (TID) | ORAL | 0 refills | Status: DC | PRN

## 2021-01-07 MED ORDER — CEPHALEXIN 500 MG PO CAPS: 500.0000 mg | ORAL_CAPSULE | Freq: Three times a day (TID) | ORAL | 0 refills | Status: DC

## 2021-01-07 NOTE — Progress Notes (Signed)
Post op medications sent to pharmacy 

## 2021-01-08 ENCOUNTER — Encounter: Payer: Self-pay | Admitting: Podiatry

## 2021-01-09 ENCOUNTER — Encounter: Payer: Self-pay | Admitting: Medical-Surgical

## 2021-01-11 ENCOUNTER — Encounter: Payer: Self-pay | Admitting: Podiatry

## 2021-01-12 ENCOUNTER — Ambulatory Visit (INDEPENDENT_AMBULATORY_CARE_PROVIDER_SITE_OTHER): Payer: Medicare Other | Admitting: Podiatry

## 2021-01-12 ENCOUNTER — Other Ambulatory Visit: Payer: Self-pay

## 2021-01-12 ENCOUNTER — Ambulatory Visit (INDEPENDENT_AMBULATORY_CARE_PROVIDER_SITE_OTHER): Payer: Medicare Other

## 2021-01-12 ENCOUNTER — Other Ambulatory Visit: Payer: Self-pay | Admitting: Medical-Surgical

## 2021-01-12 DIAGNOSIS — M21612 Bunion of left foot: Secondary | ICD-10-CM | POA: Diagnosis not present

## 2021-01-12 DIAGNOSIS — M2042 Other hammer toe(s) (acquired), left foot: Secondary | ICD-10-CM

## 2021-01-12 DIAGNOSIS — Z9889 Other specified postprocedural states: Secondary | ICD-10-CM

## 2021-01-12 MED ORDER — OMEPRAZOLE 20 MG PO CPDR: 20.0000 mg | DELAYED_RELEASE_CAPSULE | Freq: Every day | ORAL | 11 refills | Status: DC

## 2021-01-12 MED ORDER — AMOXICILLIN-POT CLAVULANATE 875-125 MG PO TABS: 1.0000 | ORAL_TABLET | Freq: Two times a day (BID) | ORAL | 0 refills | Status: DC

## 2021-01-13 ENCOUNTER — Telehealth: Payer: Self-pay | Admitting: *Deleted

## 2021-01-13 NOTE — Telephone Encounter (Signed)
Called and spoke with the patient (01-08-2021) and stated that I was calling to see how the patient was doing after surgery and patient stated that she was doing real good and took a pain pill the night of the surgery and has had no pain medicines when I called and there was not any fever or chills and not any nausea and to call the office if any concerns or questions. Donna Hester

## 2021-01-15 ENCOUNTER — Encounter: Payer: Self-pay | Admitting: Podiatry

## 2021-01-15 NOTE — Progress Notes (Signed)
Subjective: Donna Hester is a 70 y.o. is seen today in office s/p left second digit hammertoe repair, bunion correction preformed on 01/07/2021.  She is stated pain is better controlled.  Her daughter did change the bandage this she felt that things was getting tight and noticed a blister.  He still on antibiotics.  He denies any fevers or chills.  No chest pain, shortness of breath.  No other concerns.    Objective: General: No acute distress, AAOx3  DP/PT pulses palpable 2/4, CRT < 3 sec to all digits.  Left foot: Incision is well coapted without any evidence of dehiscence.  There is a large clear blister present at the distal portion incision on the hallux.  Upon draining there is only clear Of bloody drainage expressed there is no purulence.  Incisions are well coapted.  K wire intact to the second toe without any drainage or pus.  There is localized edema erythema to the foot there is no warmth and splint triple.  No ascending cellulitis.  Think this is coming more from inflammation as well No other open lesions or pre-ulcerative lesions.  No pain with calf compression, swelling, warmth, erythema.   Assessment and Plan:  Status post left foot surgery  -Treatment options discussed including all alternatives, risks, and complications -X-rays obtained and reviewed.  No evidence of acute fracture.  Hardware intact status post bunionectomy, hammertoe repair. -I cleaned the blister with alcohol and I did lance this.  No signs of infection to this area.  I cleaned the incisions.  A small amount of Betadine was applied followed by a bandage.  Discussed that her daughter can change the bandage as well. -CAM boot -Augmentin-monitor closely for any signs or symptoms of worsening redness and swelling immediately should any occur. -Ice/elevation -Pain medication as needed. -Smoking cessation discussed -Monitor for any clinical signs or symptoms of infection and DVT/PE and directed to call the  office immediately should any occur or go to the ER. -Follow-up as scheduled or sooner if any problems arise. In the meantime, encouraged to call the office with any questions, concerns, change in symptoms.   Celesta Gentile, DPM

## 2021-01-22 ENCOUNTER — Ambulatory Visit (INDEPENDENT_AMBULATORY_CARE_PROVIDER_SITE_OTHER): Payer: Medicare Other | Admitting: Podiatry

## 2021-01-22 ENCOUNTER — Other Ambulatory Visit: Payer: Self-pay

## 2021-01-22 DIAGNOSIS — Z9889 Other specified postprocedural states: Secondary | ICD-10-CM

## 2021-01-22 DIAGNOSIS — M2042 Other hammer toe(s) (acquired), left foot: Secondary | ICD-10-CM

## 2021-01-22 DIAGNOSIS — M21612 Bunion of left foot: Secondary | ICD-10-CM

## 2021-01-25 NOTE — Progress Notes (Signed)
Subjective: Donna Hester is a 70 y.o. is seen today in office s/p left second digit hammertoe repair, bunion correction preformed on 01/07/2021.  States that she is doing well.  Her daughter can change the bandage.  They are not seeing any increase in swelling or redness of the redness and swelling she had previously is much improved.  The blister that was drained does not come back.  She denies any fevers or chills.  Pain is currently controlled.  She has no other concerns today.  No chest pain or shortness of breath.     Objective: General: No acute distress, AAOx3  DP/PT pulses palpable 2/4, CRT < 3 sec to all digits.  Left foot: Incision is well coapted without any evidence of dehiscence.  There is a blister distally on the first MPJ incision has dried.  There is no evidence of dehiscence.  Edema erythema was present prior is much improved.  There is no significant erythema, ascending cellulitis there is no drainage or pus or any obvious signs of infection.  Incision appears to be healing well at this point postoperatively. No other open lesions or pre-ulcerative lesions.  No pain with calf compression, swelling, warmth, erythema.   Assessment and Plan:  Status post left foot surgery-improving  -Treatment options discussed including all alternatives, risks, and complications -Remove the sutures today.  Antibiotic ointment and a bandage applied.  Her daughter can change the bandage as well.  She can wash the foot with soap and water but do not soak the foot.  Continuing cam boot, ice and elevation.  Trula Slade DPM

## 2021-01-26 ENCOUNTER — Ambulatory Visit: Payer: Medicare Other | Admitting: Medical-Surgical

## 2021-02-05 ENCOUNTER — Encounter: Payer: Medicare Other | Admitting: Podiatry

## 2021-02-10 ENCOUNTER — Ambulatory Visit (INDEPENDENT_AMBULATORY_CARE_PROVIDER_SITE_OTHER): Payer: Medicare Other | Admitting: Podiatry

## 2021-02-10 ENCOUNTER — Other Ambulatory Visit: Payer: Self-pay

## 2021-02-10 ENCOUNTER — Ambulatory Visit (INDEPENDENT_AMBULATORY_CARE_PROVIDER_SITE_OTHER): Payer: Medicare Other

## 2021-02-10 DIAGNOSIS — Z9889 Other specified postprocedural states: Secondary | ICD-10-CM | POA: Diagnosis not present

## 2021-02-10 DIAGNOSIS — M2042 Other hammer toe(s) (acquired), left foot: Secondary | ICD-10-CM

## 2021-02-10 DIAGNOSIS — M21612 Bunion of left foot: Secondary | ICD-10-CM

## 2021-02-10 DIAGNOSIS — M7989 Other specified soft tissue disorders: Secondary | ICD-10-CM

## 2021-02-13 ENCOUNTER — Other Ambulatory Visit: Payer: Self-pay

## 2021-02-13 ENCOUNTER — Ambulatory Visit (INDEPENDENT_AMBULATORY_CARE_PROVIDER_SITE_OTHER): Payer: Medicare Other

## 2021-02-13 DIAGNOSIS — M7989 Other specified soft tissue disorders: Secondary | ICD-10-CM | POA: Diagnosis not present

## 2021-02-13 LAB — HM MAMMOGRAPHY

## 2021-02-15 NOTE — Progress Notes (Signed)
Subjective: Donna Hester is a 71 y.o. is seen today in office s/p left second digit hammertoe repair, bunion correction preformed on 01/07/2021.  States that she is doing well.  At max her pain level is 3-4/10.  She is eager to have the K wire out.  She still been icing and elevating she is not taking any pain medication at this time.  No new concerns.  No recent injury or changes.   Objective: General: No acute distress, AAOx3 -presents with husband DP/PT pulses palpable 2/4, CRT < 3 sec to all digits.  Left foot: Incision is well coapted without any evidence of dehiscence.  Incisions are healing well for this time postoperatively.  There is no drainage or pus.  No surrounding erythema, ascending cellulitis.  No signs of infection today.  The toes are in rectus position No other open lesions or pre-ulcerative lesions.  No pain with calf compression, warmth, erythema-she does have some swelling on the left side slightly worse than the right side.  She had chronic edema.  There is no erythema.  Assessment and Plan:  Status post left foot surgery-improving  -Treatment options discussed including all alternatives, risks, and complications -X-rays obtained and reviewed.  No evidence of acute fracture noted.  Status post bunionectomy as well as hammertoe repair. -I cleaned the K wire and the K wire was removed in total.  There is no signs of infection noted the K wire sites clean no drainage or pus.  Antibiotic ointment was applied followed by dressing.  She can start to wash the foot with soap and water and dry thoroughly apply antibiotic ointment and a bandage. -Continue with surgical shoe, boot for now. -Continue to ice and elevate. -Given the mild increase in swelling to the left side will order venous duplex to rule out DVT although she had chronic edema present. -Monitor for any clinical signs or symptoms of infection and directed to call the office immediately should any occur or go to the  ER.  Return in about 2 weeks (around 02/24/2021).  Repeat x-rays  Trula Slade DPM

## 2021-02-24 ENCOUNTER — Ambulatory Visit (INDEPENDENT_AMBULATORY_CARE_PROVIDER_SITE_OTHER): Payer: Medicare Other

## 2021-02-24 ENCOUNTER — Ambulatory Visit (INDEPENDENT_AMBULATORY_CARE_PROVIDER_SITE_OTHER): Payer: Medicare Other | Admitting: Podiatry

## 2021-02-24 ENCOUNTER — Other Ambulatory Visit: Payer: Self-pay

## 2021-02-24 DIAGNOSIS — Z9889 Other specified postprocedural states: Secondary | ICD-10-CM | POA: Diagnosis not present

## 2021-02-24 DIAGNOSIS — M21612 Bunion of left foot: Secondary | ICD-10-CM

## 2021-02-24 DIAGNOSIS — M2042 Other hammer toe(s) (acquired), left foot: Secondary | ICD-10-CM

## 2021-03-01 NOTE — Progress Notes (Signed)
Subjective: Donna Hester is a 71 y.o. is seen today in office s/p left second digit hammertoe repair, bunion correction preformed on 01/07/2021.  States that she has been doing well.  She is still in the surgical shoe.  She is noticed some swelling but this seems to be improving.  Pain is also improving.  Pain level is 2/10.  Denies any fevers or chills.  No recent injuries or changes otherwise.    Objective: General: No acute distress, AAOx3 -presents with husband DP/PT pulses palpable 2/4, CRT < 3 sec to all digits.  Left foot: Incision is well coapted without any evidence of dehiscence.  There is no surrounding erythema, drainage or pus or any signs of infection at surgical site.  There is mild edema still present but this seems to be improving compared to last visit.  No areas of tenderness otherwise.  MMT 5/5. No pain with calf compression, warmth, erythema-she does have some swelling on the left side slightly worse than the right side.  She had chronic edema.  There is no erythema.  Assessment and Plan:  Status post left foot surgery-improving  -Treatment options discussed including all alternatives, risks, and complications -X-rays obtained reviewed.  There is some residual hammertoe contracture still noted on x-ray since the K wires, but clinically appears to be improved.  No evidence of acute fracture. -Discussed with transition to regular shoe as tolerated and gradually increase activity level.  Continue with compression company postoperative edema.  Continue to ice elevate as well.  Trula Slade DPM

## 2021-03-24 ENCOUNTER — Ambulatory Visit (INDEPENDENT_AMBULATORY_CARE_PROVIDER_SITE_OTHER): Payer: Medicare Other | Admitting: Medical-Surgical

## 2021-03-24 ENCOUNTER — Other Ambulatory Visit: Payer: Self-pay | Admitting: Podiatry

## 2021-03-24 ENCOUNTER — Other Ambulatory Visit: Payer: Self-pay

## 2021-03-24 ENCOUNTER — Encounter: Payer: Self-pay | Admitting: Medical-Surgical

## 2021-03-24 VITALS — BP 154/73 | HR 65 | Resp 20 | Ht 61.0 in | Wt 166.4 lb

## 2021-03-24 DIAGNOSIS — I10 Essential (primary) hypertension: Secondary | ICD-10-CM

## 2021-03-24 DIAGNOSIS — R609 Edema, unspecified: Secondary | ICD-10-CM

## 2021-03-24 DIAGNOSIS — Z79899 Other long term (current) drug therapy: Secondary | ICD-10-CM

## 2021-03-24 DIAGNOSIS — F419 Anxiety disorder, unspecified: Secondary | ICD-10-CM

## 2021-03-24 DIAGNOSIS — E039 Hypothyroidism, unspecified: Secondary | ICD-10-CM | POA: Diagnosis not present

## 2021-03-24 DIAGNOSIS — E785 Hyperlipidemia, unspecified: Secondary | ICD-10-CM

## 2021-03-24 DIAGNOSIS — N183 Chronic kidney disease, stage 3 unspecified: Secondary | ICD-10-CM | POA: Diagnosis not present

## 2021-03-24 NOTE — Progress Notes (Signed)
°  HPI with pertinent ROS:   CC: 15-month follow-up  HPI: Very pleasant 71 year old female presenting today for 55-month follow-up on:  Hypertension-taking amlodipine 10 mg daily, Lasix 40 mg daily as needed, lisinopril 40 mg daily, and Toprol XL 50 mg daily.  Tolerating her medications well without side effects.  Does not regularly check her blood pressure at home but does have a cuff available.  Notes that she does have occasional ankle swelling and uses the Lasix for this. Denies CP, SOB, palpitations, dizziness, headaches, or vision changes.  Mood-using buspirone 5 mg on an as-needed basis, tolerating well without side effects.  Notes that the medication is only used sparingly and it works very well for her when she does take a dose.  Denies SI/HI.  Taking atorvastatin 40 mg daily, tolerating well without side effects.  Following a low-fat diet.  Is prescribed gabapentin 800 mg 3-4 times daily by podiatry to manage foot pain.  Is doing very well after her foot surgery and has plans to follow-up with Dr. Jacqualyn Posey in 2 weeks.  She has noted a bit of a rash on the top of her foot, nonpruritic.  Has been using Voltaren gel in the area and was instructed to use this less often and less quantity as this might be contributing to the rash.  I reviewed the past medical history, family history, social history, surgical history, and allergies today and no changes were needed.  Please see the problem list section below in epic for further details.   Physical exam:   General: Well Developed, well nourished, and in no acute distress.  Neuro: Alert and oriented x3.  HEENT: Normocephalic, atraumatic.  Skin: Warm and dry. Cardiac: Regular rate and rhythm, no murmurs rubs or gallops, no lower extremity edema.  Respiratory: Clear to auscultation bilaterally. Not using accessory muscles, speaking in full sentences.  Impression and Recommendations:    1. Essential hypertension Blood pressure above goal on  initial check and again at recheck.  Recommend checking blood pressure at home daily over the next 2 weeks.  Continue amlodipine, lisinopril, Toprol-XL, and as needed Lasix as prescribed.  Limit dietary sodium.  Blood pressure goal is 130/80 and if consistently higher than this, we will need to make adjustments.  She has an appointment with podiatry in 2 weeks and will get him to double check her blood pressure there.  I will also send her a message on MyChart in a couple of weeks to see how her home readings have been.  If still running high, we will need to make adjustments in her medication.  2. Acquired hypothyroidism Continue levothyroxine 88 mcg daily.  Last TSH was checked approximately 6 months ago and looked great.  No need to recheck for another 6 months.  3. Dyslipidemia Continue atorvastatin 40 mg daily.  Plan to recheck lipid panel in 6 months.  4. Stage 3 chronic kidney disease, unspecified whether stage 3a or 3b CKD ([place]) Stable on last lab work 3 months ago.  Plan to recheck at her 22-month follow-up  5. On statin therapy Continue atorvastatin as above.  6. Peripheral edema Continue furosemide 40 mg daily as needed.  7. Anxiety Continue buspirone 5 mg twice daily as needed.  Return in about 6 months (around 09/21/2021) for chronic disease follow up. ___________________________________________ Clearnce Sorrel, DNP, APRN, FNP-BC [facility]

## 2021-03-24 NOTE — Telephone Encounter (Signed)
Please advise 

## 2021-04-01 ENCOUNTER — Encounter: Payer: Self-pay | Admitting: Medical-Surgical

## 2021-04-04 ENCOUNTER — Encounter: Payer: Self-pay | Admitting: Medical-Surgical

## 2021-04-06 MED ORDER — QVAR REDIHALER 80 MCG/ACT IN AERB: 2.0000 | INHALATION_SPRAY | Freq: Two times a day (BID) | RESPIRATORY_TRACT | 1 refills | Status: DC

## 2021-04-06 NOTE — Telephone Encounter (Signed)
Pt's daughter called to follow up on mychart message in reference to inhaler.

## 2021-04-07 ENCOUNTER — Encounter: Payer: Self-pay | Admitting: Medical-Surgical

## 2021-04-07 ENCOUNTER — Ambulatory Visit (INDEPENDENT_AMBULATORY_CARE_PROVIDER_SITE_OTHER): Payer: Medicare Other | Admitting: Podiatry

## 2021-04-07 ENCOUNTER — Ambulatory Visit (INDEPENDENT_AMBULATORY_CARE_PROVIDER_SITE_OTHER): Payer: Medicare Other

## 2021-04-07 ENCOUNTER — Other Ambulatory Visit: Payer: Self-pay

## 2021-04-07 DIAGNOSIS — Z9889 Other specified postprocedural states: Secondary | ICD-10-CM

## 2021-04-07 DIAGNOSIS — M21612 Bunion of left foot: Secondary | ICD-10-CM | POA: Diagnosis not present

## 2021-04-07 DIAGNOSIS — M2042 Other hammer toe(s) (acquired), left foot: Secondary | ICD-10-CM

## 2021-04-08 NOTE — Progress Notes (Signed)
Subjective: Donna Hester is a 71 y.o. is seen today in office s/p left second digit hammertoe repair, bunion correction preformed on 01/07/2021.  She states that she has been doing well.  She has returned to regular shoe gear without any issues.  Still gets some intermittent swelling.  No redness or warmth.  Denies any fevers or chills.  Objective: General: No acute distress, AAOx3 -presents with husband DP/PT pulses palpable 2/4, CRT < 3 sec to all digits.  Left foot: Incision is well coapted without any evidence of dehiscence.  Scars are well formed.  Clinically the toes in rectus position.  There is minimal edema.  There is no erythema or warmth.  No area of tenderness identified. Minimal hyperkeratotic tissue present on the plantar aspect right foot submetatarsal.  No underlying ulceration drainage or signs of infection. No pain with calf compression, erythema or warmth.     Assessment and Plan:  Status post left foot surgery-improving  -Treatment options discussed including all alternatives, risks, and complications -X-rays obtained reviewed.  There is some residual hammertoe contracture still noted on x-ray and on the lateral view second toes dorsiflexed. -Encourage range of motion exercises for the second MPJ as well as my postoperative scar which likely is causing some contracture of the second MPJ.  She is to continue to these on her own at home but can refer to physical therapy if needed. -Continue ice elevate as well as compression of leg postoperative edema.  This should improve over the next couple months. -At this point as she is doing well and not having any pain and back to regular shoe on the discharge from the postoperative care however she understands to let me know if there is any changes or any concerns.  She agrees with this.  Trula Slade DPM

## 2021-04-28 ENCOUNTER — Other Ambulatory Visit: Payer: Self-pay | Admitting: Family Medicine

## 2021-04-28 MED ORDER — ARNUITY ELLIPTA 100 MCG/ACT IN AEPB: 1.0000 | INHALATION_SPRAY | Freq: Every day | RESPIRATORY_TRACT | 1 refills | Status: AC

## 2021-05-06 ENCOUNTER — Telehealth: Payer: Self-pay

## 2021-05-06 NOTE — Telephone Encounter (Addendum)
Initiated Prior authorization XIH:WTUUEKC ELLIPTA) 100 MCG/ACT ?Via: Covermymeds ?Case/Key:[id number] ?Status: approved  as of  05/06/21 ?Reason:Pt has a high co-pay of $231  ?Called [facility] to confirm the medication did go through on their end ,co-pay for this medication is high  ?Notified Pt via: Mychart ? ? ?Pt has [facility] ?[id number] ?[id number] ?[id number] ?[id number] ?

## 2021-05-08 ENCOUNTER — Other Ambulatory Visit: Payer: Self-pay | Admitting: Medical-Surgical

## 2021-07-14 ENCOUNTER — Other Ambulatory Visit: Payer: Self-pay | Admitting: Podiatry

## 2021-07-14 ENCOUNTER — Other Ambulatory Visit: Payer: Self-pay | Admitting: Medical-Surgical

## 2021-07-15 ENCOUNTER — Other Ambulatory Visit: Payer: Self-pay | Admitting: Medical-Surgical

## 2021-08-01 ENCOUNTER — Other Ambulatory Visit: Payer: Self-pay | Admitting: Medical-Surgical

## 2021-08-09 ENCOUNTER — Other Ambulatory Visit: Payer: Self-pay | Admitting: Podiatry

## 2021-08-09 MED ORDER — GABAPENTIN 800 MG PO TABS: 800.0000 mg | ORAL_TABLET | Freq: Three times a day (TID) | ORAL | 0 refills | Status: DC

## 2021-08-09 NOTE — Progress Notes (Signed)
Resent gabapentin as a 90 day supply per pharmacy request.

## 2021-09-18 ENCOUNTER — Other Ambulatory Visit: Payer: Self-pay | Admitting: Medical-Surgical

## 2021-09-21 ENCOUNTER — Ambulatory Visit: Payer: Medicare Other | Admitting: Medical-Surgical

## 2021-09-30 ENCOUNTER — Encounter: Payer: Self-pay | Admitting: General Practice

## 2021-11-01 ENCOUNTER — Other Ambulatory Visit: Payer: Self-pay | Admitting: Medical-Surgical

## 2021-11-02 NOTE — Telephone Encounter (Signed)
Patient needs an appointment for further refills.  Last office visit 03/24/2021  Canceled 09/21/2021  Last filled 08/03/2021  90 tablets sent to the pharmacy. NO refills.

## 2021-11-02 NOTE — Telephone Encounter (Signed)
Called and couldn't leave a message. tvt

## 2021-11-19 ENCOUNTER — Encounter: Payer: Self-pay | Admitting: Medical-Surgical

## 2021-11-20 MED ORDER — MELOXICAM 15 MG PO TABS: 15.0000 mg | ORAL_TABLET | Freq: Every day | ORAL | 0 refills | Status: DC

## 2021-11-20 MED ORDER — TIZANIDINE HCL 4 MG PO TABS: 4.0000 mg | ORAL_TABLET | Freq: Three times a day (TID) | ORAL | 0 refills | Status: AC | PRN

## 2021-11-20 MED ORDER — LEVOTHYROXINE SODIUM 88 MCG PO TABS: 88.0000 ug | ORAL_TABLET | Freq: Every day | ORAL | 0 refills | Status: DC

## 2021-11-20 MED ORDER — OMEPRAZOLE 20 MG PO CPDR: 20.0000 mg | DELAYED_RELEASE_CAPSULE | Freq: Every day | ORAL | 0 refills | Status: DC

## 2021-11-28 ENCOUNTER — Encounter: Payer: Self-pay | Admitting: Podiatry

## 2021-11-30 ENCOUNTER — Other Ambulatory Visit: Payer: Self-pay | Admitting: Podiatry

## 2021-11-30 NOTE — Progress Notes (Signed)
Error

## 2021-12-03 ENCOUNTER — Other Ambulatory Visit: Payer: Self-pay | Admitting: Podiatry

## 2021-12-03 DIAGNOSIS — M2042 Other hammer toe(s) (acquired), left foot: Secondary | ICD-10-CM

## 2021-12-03 DIAGNOSIS — G629 Polyneuropathy, unspecified: Secondary | ICD-10-CM

## 2021-12-03 DIAGNOSIS — M21612 Bunion of left foot: Secondary | ICD-10-CM

## 2021-12-06 ENCOUNTER — Other Ambulatory Visit: Payer: Self-pay | Admitting: Medical-Surgical

## 2021-12-06 DIAGNOSIS — E039 Hypothyroidism, unspecified: Secondary | ICD-10-CM

## 2021-12-19 ENCOUNTER — Other Ambulatory Visit: Payer: Self-pay | Admitting: Medical-Surgical

## 2021-12-22 NOTE — Telephone Encounter (Signed)
Patient is scheduled for 11/21 with PCP, cleared to do both at same appointment. Donna Hester

## 2021-12-25 ENCOUNTER — Telehealth: Payer: Self-pay | Admitting: Podiatry

## 2021-12-25 NOTE — Telephone Encounter (Signed)
Received notification that the patient needs to be seen within the last 6 months for diabetic shoes. I have not seen her since February. If she wants to proceed with diabetic shoes I need to see her. I wonder if a virtual visit would count? We can try that. Thanks

## 2021-12-28 NOTE — Telephone Encounter (Signed)
Noted, thank

## 2021-12-28 NOTE — Telephone Encounter (Signed)
Spoke with patient to schedule appointment, she has a lot going on right now , she will call back when her schedule is a little more clear.

## 2021-12-28 NOTE — Progress Notes (Unsigned)
   Established Patient Office Visit  Subjective   Patient ID: Donna Hester, female   DOB: 15-Oct-1950 Age: 71 y.o. MRN: [id number]   No chief complaint on file.   HPI    Objective:    There were no vitals filed for this visit.  Physical Exam   No results found for this or any previous visit (from the past 24 hour(s)).   {Labs (Optional):23779}  The ASCVD Risk score (Arnett DK, et al., 2019) failed to calculate for the following reasons:   The patient has a prior MI or stroke diagnosis   Assessment & Plan:   No problem-specific Assessment & Plan notes found for this encounter.   No follow-ups on file.  ___________________________________________ Clearnce Sorrel, DNP, APRN, FNP-BC [facility]

## 2021-12-29 ENCOUNTER — Ambulatory Visit (INDEPENDENT_AMBULATORY_CARE_PROVIDER_SITE_OTHER): Payer: Medicare Other | Admitting: Medical-Surgical

## 2021-12-29 ENCOUNTER — Encounter: Payer: Self-pay | Admitting: Medical-Surgical

## 2021-12-29 VITALS — BP 119/69 | HR 66 | Ht 61.0 in | Wt 164.0 lb

## 2021-12-29 DIAGNOSIS — E039 Hypothyroidism, unspecified: Secondary | ICD-10-CM

## 2021-12-29 DIAGNOSIS — Z833 Family history of diabetes mellitus: Secondary | ICD-10-CM | POA: Diagnosis not present

## 2021-12-29 DIAGNOSIS — Z720 Tobacco use: Secondary | ICD-10-CM

## 2021-12-29 DIAGNOSIS — R0789 Other chest pain: Secondary | ICD-10-CM

## 2021-12-29 DIAGNOSIS — I10 Essential (primary) hypertension: Secondary | ICD-10-CM

## 2021-12-29 MED ORDER — ALBUTEROL SULFATE HFA 108 (90 BASE) MCG/ACT IN AERS: 2.0000 | INHALATION_SPRAY | Freq: Four times a day (QID) | RESPIRATORY_TRACT | 11 refills | Status: AC | PRN

## 2021-12-30 LAB — CBC WITH DIFFERENTIAL/PLATELET
Absolute Monocytes: 1088 cells/uL — ABNORMAL HIGH (ref 200–950)
Basophils Absolute: 58 cells/uL (ref 0–200)
Basophils Relative: 0.4 %
Eosinophils Absolute: 247 cells/uL (ref 15–500)
Eosinophils Relative: 1.7 %
HCT: 42.6 % (ref 35.0–45.0)
Hemoglobin: 15.1 g/dL (ref 11.7–15.5)
Lymphs Abs: 3205 cells/uL (ref 850–3900)
MCH: 33 pg (ref 27.0–33.0)
MCHC: 35.4 g/dL (ref 32.0–36.0)
MCV: 93.2 fL (ref 80.0–100.0)
MPV: 11.1 fL (ref 7.5–12.5)
Monocytes Relative: 7.5 %
Neutro Abs: 9904 cells/uL — ABNORMAL HIGH (ref 1500–7800)
Neutrophils Relative %: 68.3 %
Platelets: 338 10*3/uL (ref 140–400)
RBC: 4.57 10*6/uL (ref 3.80–5.10)
RDW: 13.8 % (ref 11.0–15.0)
Total Lymphocyte: 22.1 %
WBC: 14.5 10*3/uL — ABNORMAL HIGH (ref 3.8–10.8)

## 2021-12-30 LAB — COMPLETE METABOLIC PANEL WITH GFR
AG Ratio: 1.6 (calc) (ref 1.0–2.5)
ALT: 14 U/L (ref 6–29)
AST: 12 U/L (ref 10–35)
Albumin: 4.2 g/dL (ref 3.6–5.1)
Alkaline phosphatase (APISO): 88 U/L (ref 37–153)
BUN/Creatinine Ratio: 24 (calc) — ABNORMAL HIGH (ref 6–22)
BUN: 33 mg/dL — ABNORMAL HIGH (ref 7–25)
CO2: 26 mmol/L (ref 20–32)
Calcium: 10 mg/dL (ref 8.6–10.4)
Chloride: 104 mmol/L (ref 98–110)
Creat: 1.4 mg/dL — ABNORMAL HIGH (ref 0.50–1.05)
Globulin: 2.6 g/dL (calc) (ref 1.9–3.7)
Glucose, Bld: 180 mg/dL — ABNORMAL HIGH (ref 65–99)
Potassium: 4.5 mmol/L (ref 3.5–5.3)
Sodium: 138 mmol/L (ref 135–146)
Total Bilirubin: 0.9 mg/dL (ref 0.2–1.2)
Total Protein: 6.8 g/dL (ref 6.1–8.1)
eGFR: 41 mL/min/{1.73_m2} — ABNORMAL LOW (ref >=60)

## 2021-12-30 LAB — LIPID PANEL
Cholesterol: 139 mg/dL (ref <200)
HDL: 41 mg/dL — ABNORMAL LOW (ref >=50)
LDL Cholesterol (Calc): 75 mg/dL (calc)
Non-HDL Cholesterol (Calc): 98 mg/dL (calc) (ref <130)
Total CHOL/HDL Ratio: 3.4 (calc) (ref <5.0)
Triglycerides: 150 mg/dL — ABNORMAL HIGH (ref <150)

## 2021-12-30 LAB — TSH: TSH: 0.42 mIU/L (ref 0.40–4.50)

## 2022-01-04 ENCOUNTER — Other Ambulatory Visit: Payer: Self-pay

## 2022-01-04 ENCOUNTER — Encounter: Payer: Self-pay | Admitting: Medical-Surgical

## 2022-01-25 ENCOUNTER — Other Ambulatory Visit: Payer: Self-pay | Admitting: Podiatry

## 2022-01-25 ENCOUNTER — Other Ambulatory Visit: Payer: Self-pay | Admitting: Medical-Surgical

## 2022-01-25 DIAGNOSIS — E039 Hypothyroidism, unspecified: Secondary | ICD-10-CM

## 2022-01-26 ENCOUNTER — Ambulatory Visit: Payer: Medicare Other

## 2022-01-26 ENCOUNTER — Ambulatory Visit: Payer: Medicare Other | Admitting: Medical-Surgical

## 2022-02-05 ENCOUNTER — Other Ambulatory Visit: Payer: Self-pay | Admitting: Medical-Surgical

## 2022-02-17 ENCOUNTER — Other Ambulatory Visit: Payer: Self-pay | Admitting: Medical-Surgical

## 2022-02-22 LAB — HM MAMMOGRAPHY

## 2022-02-23 ENCOUNTER — Other Ambulatory Visit: Payer: Self-pay | Admitting: Medical-Surgical

## 2022-02-23 DIAGNOSIS — E039 Hypothyroidism, unspecified: Secondary | ICD-10-CM

## 2022-03-01 ENCOUNTER — Encounter: Payer: Self-pay | Admitting: Medical-Surgical

## 2022-03-02 ENCOUNTER — Ambulatory Visit: Payer: Medicare Other | Admitting: Medical-Surgical

## 2022-03-02 DIAGNOSIS — Z91199 Patient's noncompliance with other medical treatment and regimen due to unspecified reason: Secondary | ICD-10-CM

## 2022-03-02 NOTE — Progress Notes (Unsigned)
   Established Patient Office Visit  Subjective   Patient ID: Donna Hester, female   DOB: 09/29/1950 Age: 72 y.o. MRN: [id number]   No chief complaint on file.   HPI    Objective:    There were no vitals filed for this visit.  Physical Exam   No results found for this or any previous visit (from the past 24 hour(s)).   {Labs (Optional):23779}  The ASCVD Risk score (Arnett DK, et al., 2019) failed to calculate for the following reasons:   The patient has a prior MI or stroke diagnosis   Assessment & Plan:   No problem-specific Assessment & Plan notes found for this encounter.   No follow-ups on file.  ___________________________________________ Thayer Ohm, DNP, APRN, FNP-BC [facility]

## 2022-03-04 ENCOUNTER — Encounter: Payer: Self-pay | Admitting: Medical-Surgical

## 2022-03-09 ENCOUNTER — Other Ambulatory Visit: Payer: Self-pay | Admitting: Medical-Surgical

## 2022-03-09 DIAGNOSIS — E039 Hypothyroidism, unspecified: Secondary | ICD-10-CM

## 2022-03-25 ENCOUNTER — Ambulatory Visit: Payer: Medicare Other | Admitting: Podiatry

## 2022-04-05 ENCOUNTER — Ambulatory Visit: Payer: Medicare Other | Admitting: Podiatry

## 2022-04-28 ENCOUNTER — Other Ambulatory Visit: Payer: Self-pay | Admitting: Podiatry

## 2022-07-10 IMAGING — US US EXTREM LOW VENOUS*L*
1 series · 13 of 24 positions shown · non-contrast
Comparison: None.

CLINICAL DATA: Left leg swelling



[Series 1: us venous img lower uni left (dvt) · portal-venous · 13 of 45 slices shown]
[im 1/45]
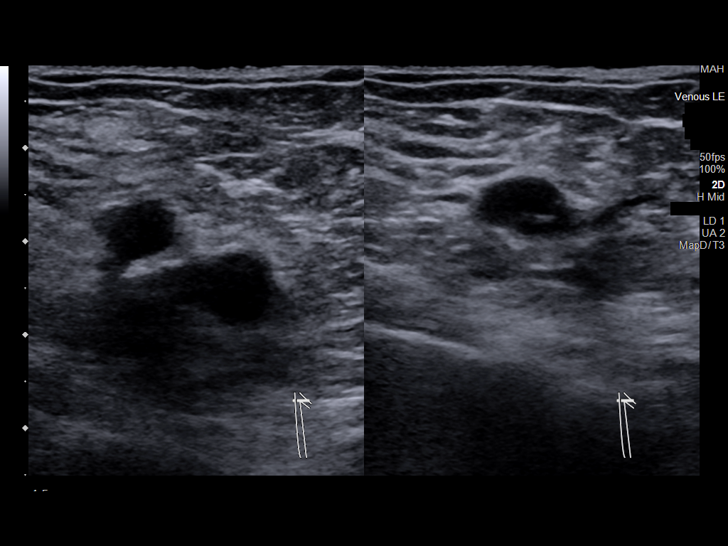
[im 4/45]
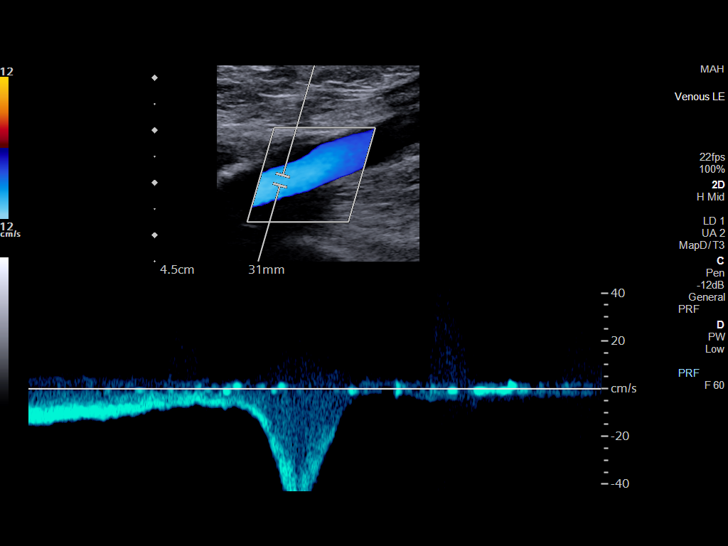
[im 8/45]
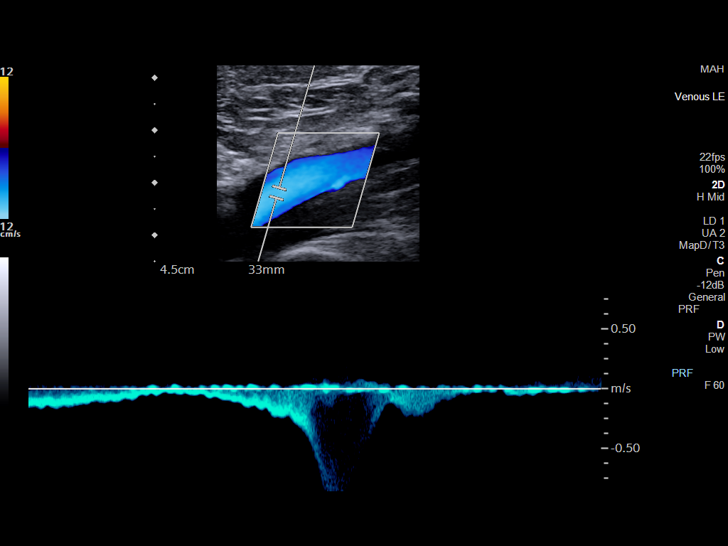
[im 12/45]
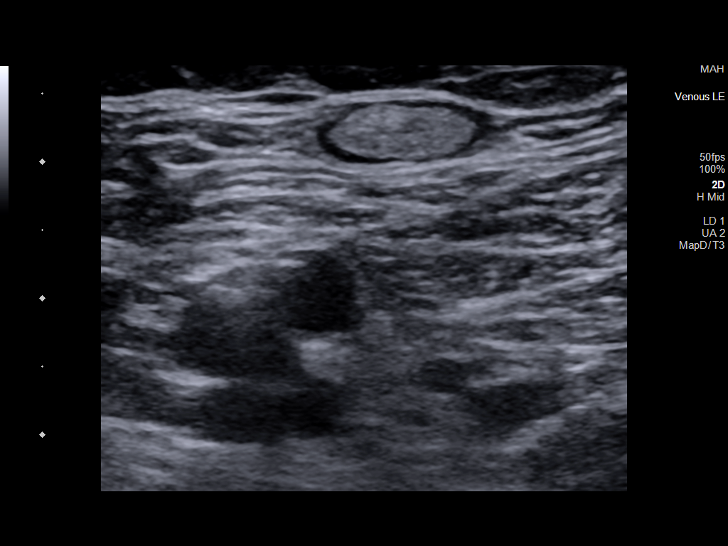
[im 16/45]
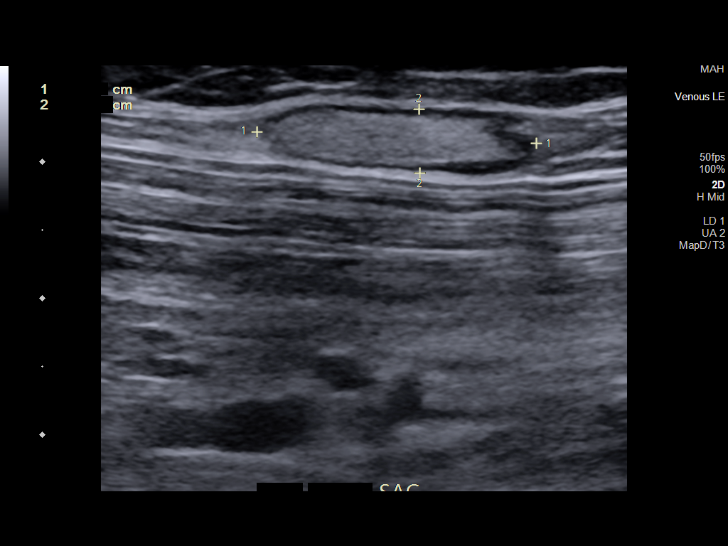
[im 20/45]
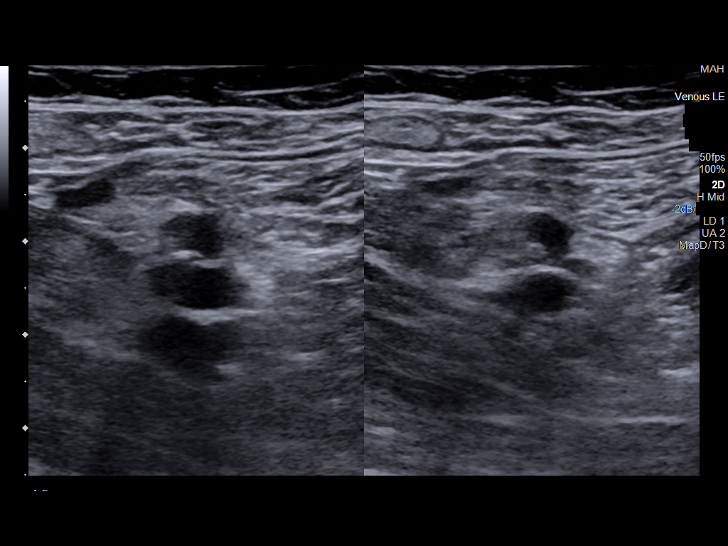
[im 23/45]
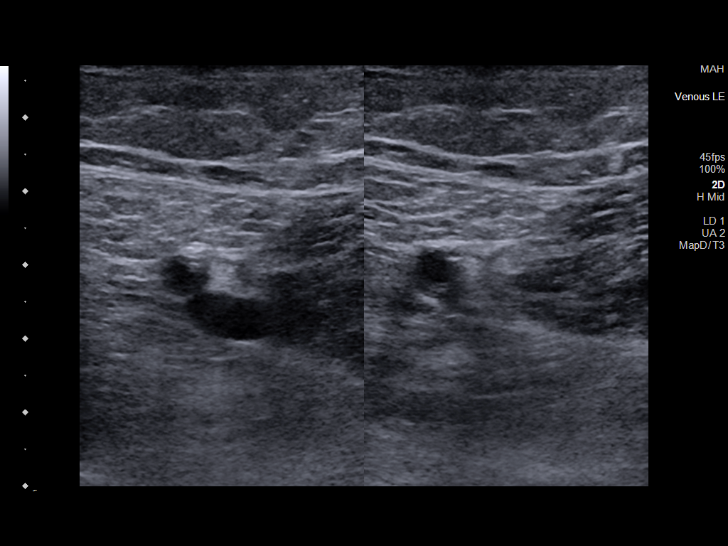
[im 25/45]
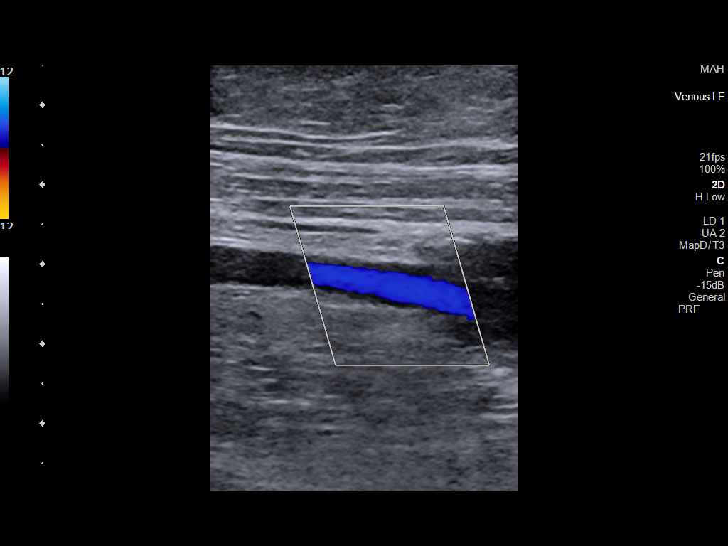
[im 29/45]
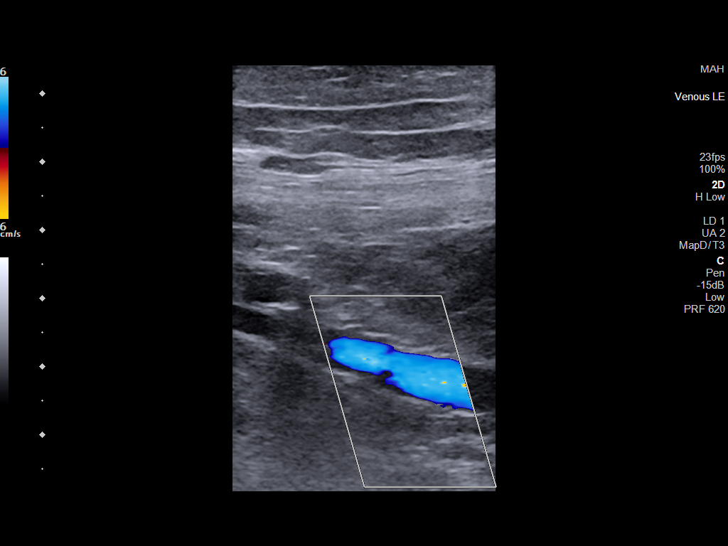
[im 33/45]
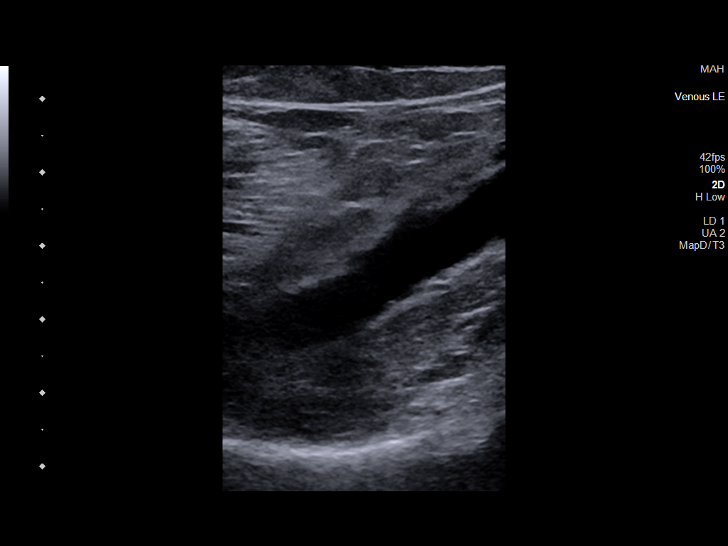
[im 37/45]
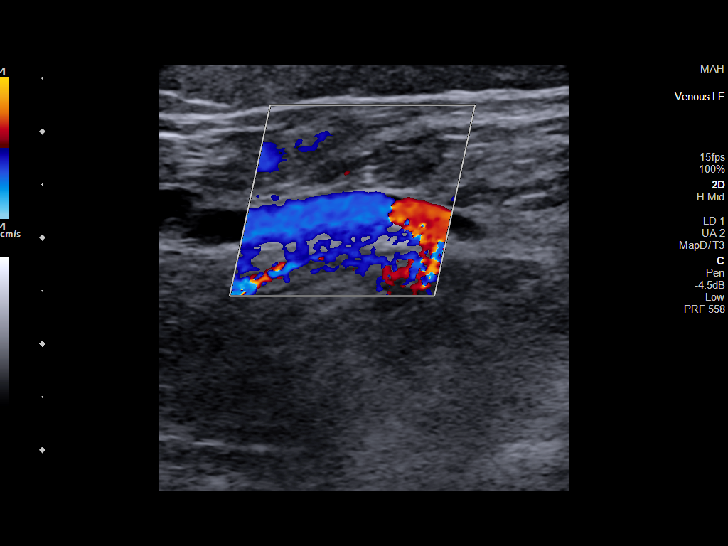
[im 41/45]
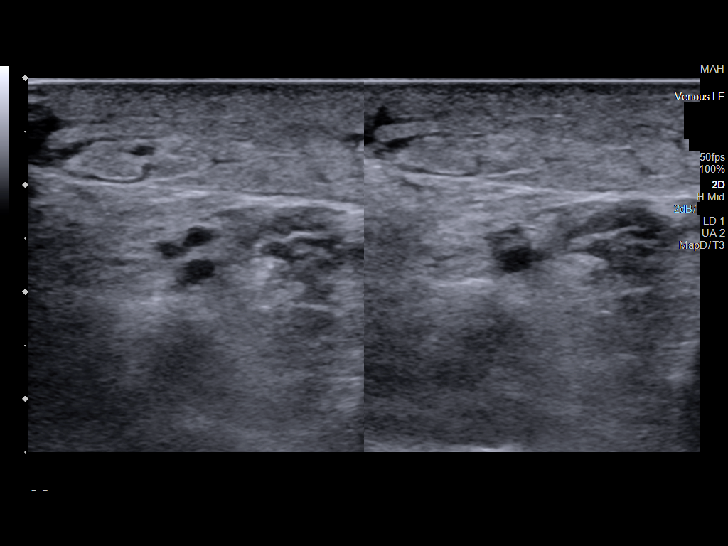
[im 45/45]
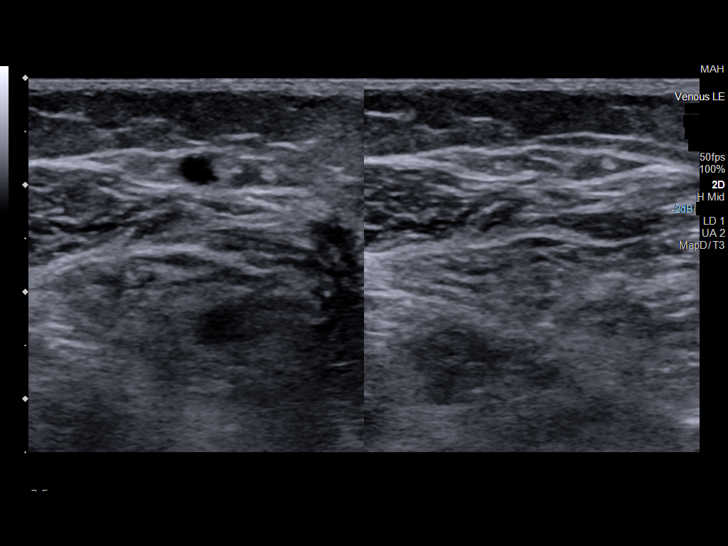

[13 of 24 positions shown; findings below may reference images not displayed]

FINDINGS: Contralateral Common Femoral Vein: Respiratory phasicity is normal
and symmetric with the symptomatic side. No evidence of thrombus.
Normal compressibility.

Common Femoral Vein: No evidence of thrombus. Normal
compressibility, respiratory phasicity and response to augmentation.

Saphenofemoral Junction: No evidence of thrombus. Normal
compressibility and flow on color Doppler imaging.

Profunda Femoral Vein: No evidence of thrombus. Normal
compressibility and flow on color Doppler imaging.

Femoral Vein: No evidence of thrombus. Normal compressibility,
respiratory phasicity and response to augmentation.

Popliteal Vein: No evidence of thrombus. Normal compressibility,
respiratory phasicity and response to augmentation.

Calf Veins: No evidence of thrombus. Normal compressibility and flow
on color Doppler imaging.

Other Findings: Prominent groin lymph node demonstrates thin cortex
with normal morphology.
IMPRESSION: No evidence of deep venous thrombosis.

## 2022-07-29 ENCOUNTER — Other Ambulatory Visit: Payer: Self-pay | Admitting: Podiatry

## 2022-10-07 ENCOUNTER — Ambulatory Visit (INDEPENDENT_AMBULATORY_CARE_PROVIDER_SITE_OTHER): Payer: Medicare Other | Admitting: Podiatry

## 2022-10-07 ENCOUNTER — Encounter: Payer: Self-pay | Admitting: Podiatry

## 2022-10-07 DIAGNOSIS — D2371 Other benign neoplasm of skin of right lower limb, including hip: Secondary | ICD-10-CM | POA: Diagnosis not present

## 2022-10-07 DIAGNOSIS — D492 Neoplasm of unspecified behavior of bone, soft tissue, and skin: Secondary | ICD-10-CM | POA: Diagnosis not present

## 2022-10-07 NOTE — Progress Notes (Addendum)
Subjective:  Patient ID: Donna Hester, female    DOB: 1950-09-18,   MRN: [id number]  Chief Complaint  Patient presents with   Nail Problem    72 y.o. female presents for concern of some spots on her toes and the bottom of her right foot. They have been present before and had them trimmed. Relates her sones wedding is coming up and wants to improve her feet . Denies any other pedal complaints. Denies n/v/f/c.   Past Medical History:  Diagnosis Date   CHF (congestive heart failure) (HCC)    Coronary artery disease     Objective:  Physical Exam: Vascular: DP/PT pulses 2/4 bilateral. CFT <3 seconds. Normal hair growth on digits. No edema.  Skin. No lacerations or abrasions bilateral feet. Hyperkeratotic cored lesion with disruption of skin lines noted to plantar third metatarsal head on right and distal second and fourth digits.  Musculoskeletal: MMT 5/5 bilateral lower extremities in DF, PF, Inversion and Eversion. Deceased ROM in DF of ankle joint.  Neurological: Sensation intact to light touch.   Assessment:   1. Benign neoplasm of skin of foot, right      Plan:  Patient was evaluated and treated and all questions answered. -Discussed lesions of the feet.  with patient and treatment options.  -Hyperkeratotic tissue was debrided with chisel without incident.  -Applied salycylic acid treatment to area with dressing. Advised to remove bandaging tomorrow.  -Encouraged daily moisturizing -Discussed use of pumice stone -Advised good supportive shoes and inserts -Patient to return to office as needed or sooner if condition worsens.   Louann Sjogren, DPM

## 2022-11-04 ENCOUNTER — Other Ambulatory Visit: Payer: Self-pay | Admitting: Podiatry

## 2022-12-06 ENCOUNTER — Other Ambulatory Visit: Payer: Self-pay | Admitting: Medical-Surgical

## 2022-12-06 DIAGNOSIS — E039 Hypothyroidism, unspecified: Secondary | ICD-10-CM

## 2022-12-12 ENCOUNTER — Encounter: Payer: Self-pay | Admitting: Podiatry

## 2022-12-12 ENCOUNTER — Other Ambulatory Visit: Payer: Self-pay | Admitting: Podiatry

## 2022-12-12 MED ORDER — GABAPENTIN 800 MG PO TABS: 800.0000 mg | ORAL_TABLET | Freq: Three times a day (TID) | ORAL | 2 refills | Status: AC

## 2023-01-14 ENCOUNTER — Other Ambulatory Visit: Payer: Self-pay | Admitting: Medical-Surgical

## 2023-01-14 DIAGNOSIS — E039 Hypothyroidism, unspecified: Secondary | ICD-10-CM

## 2023-02-08 ENCOUNTER — Other Ambulatory Visit: Payer: Self-pay | Admitting: Medical-Surgical

## 2023-02-18 ENCOUNTER — Other Ambulatory Visit: Payer: Self-pay | Admitting: Medical-Surgical

## 2023-02-18 DIAGNOSIS — E039 Hypothyroidism, unspecified: Secondary | ICD-10-CM

## 2023-03-11 ENCOUNTER — Other Ambulatory Visit: Payer: Self-pay | Admitting: Medical-Surgical

## 2023-03-26 ENCOUNTER — Other Ambulatory Visit: Payer: Self-pay | Admitting: Medical-Surgical

## 2023-03-30 ENCOUNTER — Other Ambulatory Visit: Payer: Self-pay | Admitting: Medical-Surgical

## 2023-04-04 ENCOUNTER — Encounter: Payer: Self-pay | Admitting: Podiatry

## 2023-04-04 ENCOUNTER — Ambulatory Visit: Payer: Medicare (Managed Care) | Admitting: Podiatry

## 2023-04-04 DIAGNOSIS — M2041 Other hammer toe(s) (acquired), right foot: Secondary | ICD-10-CM | POA: Diagnosis not present

## 2023-04-04 DIAGNOSIS — D2371 Other benign neoplasm of skin of right lower limb, including hip: Secondary | ICD-10-CM | POA: Diagnosis not present

## 2023-04-04 NOTE — Progress Notes (Unsigned)
  Subjective:  Patient ID: Donna Hester, female    DOB: [birth year],   MRN: [id number]  Chief Complaint  Patient presents with   Callouses    RM#13 Callous and toe nail issue on right foot.    73 y.o. female presents for concern today for nails may be trimmed though she also gets calluses to both of her feet.  No open lesions that she reports of any recent injuries.  Objective:  Physical Exam: Vascular: DP/PT pulses 2/4 bilateral. CFT <3 seconds. Normal hair growth on digits. No edema.  Skin. No lacerations or abrasions bilateral feet. Hyperkeratotic cored lesion with disruption of skin lines noted to plantar third metatarsal head on right and distal second and fourth digits.  There is no underlying ulceration, drainage or signs of infection.  The nails that she has remaining are minimally elongated.  She has more dry skin on the toe but nailbeds. Musculoskeletal: MMT 5/5 bilateral lower extremities in DF, PF, Inversion and Eversion. Deceased ROM in DF of ankle joint.  Hammertoes present mostly the right fourth toe she has a callus on the distal portion of the toe. Neurological: Sensation intact to light touch.   Assessment:   1. Benign neoplasm of skin of foot, right      Plan:  Patient was evaluated and treated and all questions answered. -Discussed lesions of the feet.  with patient and treatment options.  -Hyperkeratotic tissue was debrided with chisel without any complications or bleeding.  She has a hammertoe on the right fourth toe that seems with most of the calluses localized.  Discussed with her doing a flexor tenotomy.  Discussed the procedures postoperative course.  She will to proceed with this.  I will schedule her for this in the office. -Encouraged daily moisturizing and offloading. -Advised good supportive shoes and inserts -Patient to return to office as needed or sooner if condition worsens.  No follow-ups on file.  Vivi Barrack DPM

## 2023-04-07 ENCOUNTER — Telehealth: Payer: Self-pay | Admitting: Podiatry

## 2023-04-07 NOTE — Telephone Encounter (Signed)
 Left message for pt to call me directly to get scheduled for a Thursday afternoon at 4pm for the tenotomy procedure per Dr Ardelle Anton.

## 2023-04-11 NOTE — Telephone Encounter (Signed)
 Pt lvm today at 137pm stating she would need the appt mid April.  I returned call and pt is scheduled for 4/17 at 345pm..

## 2023-05-26 ENCOUNTER — Ambulatory Visit: Payer: Medicare (Managed Care) | Admitting: Podiatry

## 2023-05-26 ENCOUNTER — Encounter: Payer: Self-pay | Admitting: Podiatry

## 2023-05-26 DIAGNOSIS — M2041 Other hammer toe(s) (acquired), right foot: Secondary | ICD-10-CM

## 2023-05-30 NOTE — Progress Notes (Signed)
  Subjective:  Patient ID: Donna Hester, female    DOB: [birth year],   MRN: [id number] Chief Complaint  Patient presents with   Bunions    RM#12 Right foot tenotomy      73 y.o. female presents for concern today for altered tenotomy procedure of the right fourth toe given painful preulcerative callus at the tip of the toe.  She does not report any open lesions.  The calluses that she had on the plantar aspect of the feet right side worse than left is doing better.  Not causing significant pain.  Objective:  Physical Exam: Vascular: DP/PT pulses 2/4 bilateral. CFT <3 seconds. Normal hair growth on digits. No edema.  Skin.  Hyperkeratotic lesion noted at the distal aspect the right fourth toe without any underlying ulceration, drainage or signs of infection.  No open lesions bilaterally.   Musculoskeletal:  Hammertoes present mostly the right fourth toe she has a callus on the distal portion of the toe. Neurological: Sensation intact to light touch.   Assessment:   Hammertoe deformity right foot resulting in preulcerative hyperkeratotic lesion  Plan:  Patient was evaluated and treated and all questions answered. -Discussed lesions of the feet.  with patient and treatment options.  -At this time we discussed flexor tenotomy of the right fourth toe we discussed the procedure as well as postoperative course.  Discussed alternatives, risks, complications.  She wishes to proceed.  Consent was signed.  I cleaned the skin with alcohol.  Mixture of 3 mL of lidocaine, Marcaine plain was infiltrated and digital block fashion.  Once sensitized I prepped the toe with Betadine, alcohol.  I then introduced an 18-gauge needle to the plantar aspect the toe in order to transect the flexor tendon.  This time the toe was sitting rectus.  She tolerated well.  Irrigated with alcohol.  Antibiotic ointment is applied followed by dressing.  Postprocedure instructions discussed.  Monitor for any signs or symptoms of  infection. -Toe crest dispensed help support the toe. -Debrided the callus on the right fourth toe without any complications or bleeding.  Return in about 2 weeks (around 06/09/2023) for flexor tenotomy follow-up.  Charity Conch DPM
